# Patient Record
Sex: Male | Born: 1945 | Race: Black or African American | Hispanic: No | Marital: Single | Smoking: Former smoker
Health system: Southern US, Community
[De-identification: ages and names within clinical notes are randomized; demographics above are authoritative.]

## PROBLEM LIST (undated history)

## (undated) DIAGNOSIS — I10 Essential (primary) hypertension: Secondary | ICD-10-CM

## (undated) DIAGNOSIS — I2510 Atherosclerotic heart disease of native coronary artery without angina pectoris: Secondary | ICD-10-CM

## (undated) HISTORY — PX: CORONARY ARTERY BYPASS GRAFT: SHX141

---

## 2011-04-30 ENCOUNTER — Emergency Department: Payer: Self-pay | Admitting: Emergency Medicine

## 2011-04-30 LAB — CBC
HCT: 45.2 % (ref 40.0–52.0)
MCH: 33.9 pg (ref 26.0–34.0)
MCHC: 34.3 g/dL (ref 32.0–36.0)
RDW: 14.4 % (ref 11.5–14.5)

## 2011-04-30 LAB — COMPREHENSIVE METABOLIC PANEL
BUN: 8 mg/dL (ref 7–18)
Calcium, Total: 8.8 mg/dL (ref 8.5–10.1)
Chloride: 108 mmol/L — ABNORMAL HIGH (ref 98–107)
Co2: 27 mmol/L (ref 21–32)
EGFR (African American): >60
SGOT(AST): 31 U/L (ref 15–37)
SGPT (ALT): 24 U/L
Sodium: 147 mmol/L — ABNORMAL HIGH (ref 136–145)

## 2011-04-30 LAB — SALICYLATE LEVEL: Salicylates, Serum: 2.3 mg/dL

## 2011-04-30 LAB — ETHANOL: Ethanol: 258 mg/dL

## 2017-04-09 ENCOUNTER — Emergency Department
Admission: EM | Admit: 2017-04-09 | Discharge: 2017-04-10 | Discharge status: Against Medical Advice | Payer: Medicare Other | Attending: Emergency Medicine | Admitting: Emergency Medicine

## 2017-04-09 ENCOUNTER — Encounter: Payer: Self-pay | Admitting: Emergency Medicine

## 2017-04-09 ENCOUNTER — Emergency Department: Payer: Self-pay

## 2017-04-09 DIAGNOSIS — Z23 Encounter for immunization: Secondary | ICD-10-CM | POA: Insufficient documentation

## 2017-04-09 DIAGNOSIS — W19XXXA Unspecified fall, initial encounter: Secondary | ICD-10-CM | POA: Insufficient documentation

## 2017-04-09 DIAGNOSIS — I2510 Atherosclerotic heart disease of native coronary artery without angina pectoris: Secondary | ICD-10-CM | POA: Insufficient documentation

## 2017-04-09 DIAGNOSIS — S0181XA Laceration without foreign body of other part of head, initial encounter: Secondary | ICD-10-CM

## 2017-04-09 DIAGNOSIS — Z87891 Personal history of nicotine dependence: Secondary | ICD-10-CM | POA: Insufficient documentation

## 2017-04-09 DIAGNOSIS — R51 Headache: Secondary | ICD-10-CM | POA: Insufficient documentation

## 2017-04-09 DIAGNOSIS — Y999 Unspecified external cause status: Secondary | ICD-10-CM | POA: Insufficient documentation

## 2017-04-09 DIAGNOSIS — F10129 Alcohol abuse with intoxication, unspecified: Secondary | ICD-10-CM | POA: Insufficient documentation

## 2017-04-09 DIAGNOSIS — S0990XA Unspecified injury of head, initial encounter: Secondary | ICD-10-CM

## 2017-04-09 DIAGNOSIS — Y939 Activity, unspecified: Secondary | ICD-10-CM | POA: Insufficient documentation

## 2017-04-09 DIAGNOSIS — F10929 Alcohol use, unspecified with intoxication, unspecified: Secondary | ICD-10-CM

## 2017-04-09 DIAGNOSIS — Y929 Unspecified place or not applicable: Secondary | ICD-10-CM | POA: Insufficient documentation

## 2017-04-09 DIAGNOSIS — Y908 Blood alcohol level of 240 mg/100 ml or more: Secondary | ICD-10-CM | POA: Insufficient documentation

## 2017-04-09 DIAGNOSIS — I10 Essential (primary) hypertension: Secondary | ICD-10-CM | POA: Insufficient documentation

## 2017-04-09 DIAGNOSIS — F1010 Alcohol abuse, uncomplicated: Secondary | ICD-10-CM

## 2017-04-09 HISTORY — DX: Essential (primary) hypertension: I10

## 2017-04-09 HISTORY — DX: Atherosclerotic heart disease of native coronary artery without angina pectoris: I25.10

## 2017-04-09 LAB — COMPREHENSIVE METABOLIC PANEL
ALBUMIN: 4.5 g/dL (ref 3.5–5.0)
ALK PHOS: 51 U/L (ref 38–126)
ALT: 23 U/L (ref 17–63)
AST: 50 U/L — AB (ref 15–41)
Anion gap: 12 (ref 5–15)
BILIRUBIN TOTAL: 0.8 mg/dL (ref 0.3–1.2)
BUN: 14 mg/dL (ref 6–20)
CALCIUM: 8.8 mg/dL — AB (ref 8.9–10.3)
CO2: 22 mmol/L (ref 22–32)
CREATININE: 1 mg/dL (ref 0.61–1.24)
Chloride: 102 mmol/L (ref 101–111)
GFR calc Af Amer: >60 mL/min (ref >60)
GFR calc non Af Amer: >60 mL/min (ref >60)
GLUCOSE: 84 mg/dL (ref 65–99)
POTASSIUM: 3.9 mmol/L (ref 3.5–5.1)
Sodium: 136 mmol/L (ref 135–145)
TOTAL PROTEIN: 8 g/dL (ref 6.5–8.1)

## 2017-04-09 LAB — CBC WITH DIFFERENTIAL/PLATELET
BASOS ABS: 0 10*3/uL (ref 0–0.1)
BASOS PCT: 0 %
EOS ABS: 0 10*3/uL (ref 0–0.7)
Eosinophils Relative: 0 %
HCT: 43.5 % (ref 40.0–52.0)
Hemoglobin: 14.9 g/dL (ref 13.0–18.0)
LYMPHS ABS: 3.3 10*3/uL (ref 1.0–3.6)
LYMPHS PCT: 52 %
MCH: 33.9 pg (ref 26.0–34.0)
MCHC: 34.3 g/dL (ref 32.0–36.0)
MCV: 98.9 fL (ref 80.0–100.0)
MONO ABS: 0.8 10*3/uL (ref 0.2–1.0)
Monocytes Relative: 12 %
NEUTROS ABS: 2.3 10*3/uL (ref 1.4–6.5)
Neutrophils Relative %: 36 %
PLATELETS: 163 10*3/uL (ref 150–440)
RBC: 4.4 MIL/uL (ref 4.40–5.90)
RDW: 14.5 % (ref 11.5–14.5)
WBC: 6.4 10*3/uL (ref 3.8–10.6)

## 2017-04-09 LAB — ETHANOL: ALCOHOL ETHYL (B): 291 mg/dL — AB (ref <10)

## 2017-04-09 MED ORDER — TETANUS-DIPHTH-ACELL PERTUSSIS 5-2.5-18.5 LF-MCG/0.5 IM SUSP
0.5000 mL | Freq: Once | INTRAMUSCULAR | Status: AC
  Administered 2017-04-09: 0.5 mL via INTRAMUSCULAR
  Filled 2017-04-09: qty 0.5

## 2017-04-09 MED ORDER — HALOPERIDOL LACTATE 5 MG/ML IJ SOLN
5.0000 mg | Freq: Once | INTRAMUSCULAR | Status: AC
  Administered 2017-04-09: 5 mg via INTRAVENOUS
  Filled 2017-04-09: qty 1
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
## 2017-04-09 NOTE — ED Notes (Signed)
Patient transported to CT 

## 2017-04-09 NOTE — ED Provider Notes (Signed)
[facility] Emergency Department Provider Note  ____________________________________________   First MD Initiated Contact with Patient 04/09/17 2129     (approximate)  I have reviewed the triage vital signs and the nursing notes.   HISTORY  Chief Complaint Fall and Alcohol Intoxication  Level 5 exemption history limited by the patient's alcohol intoxication  HPI Edgar Mcguire is a 72 y.o. male who comes to the emergency department via EMS after apparently falling down while intoxicated.  The patient is unable to provide any further history as he is heavily intoxicated and uncooperative.  Past Medical History:  Diagnosis Date  . Coronary artery disease   . Hypertension     There are no active problems to display for this patient.     Prior to Admission medications   Not on File    Allergies Patient has no known allergies.  No family history on file.  Social History Social History   Tobacco Use  . Smoking status: Former Games developer  . Smokeless tobacco: Never Used  Substance Use Topics  . Alcohol use: Yes    Alcohol/week: 100.8 oz    Types: 168 Cans of beer per week  . Drug use: Yes    Types: Cocaine    Review of Systems Level 5 exemption history limited by the patient's alcohol intoxication  ____________________________________________   PHYSICAL EXAM:  VITAL SIGNS: ED Triage Vitals  Enc Vitals Group     BP      Pulse      Resp      Temp      Temp src      SpO2      Weight      Height      Head Circumference      Peak Flow      Pain Score      Pain Loc      Pain Edu?      Excl. in GC?     Constitutional: Heavily intoxicated.  Obvious trauma to his right brow with some bleeding.  Uncooperative cursing Eyes: PERRL EOMI. Head: 2 cm laceration right brow. Nose: No congestion/rhinnorhea. Mouth/Throat: No trismus Neck: No stridor.   Cardiovascular: Normal rate, regular rhythm. Grossly normal heart sounds.  Good  peripheral circulation. Respiratory: Normal respiratory effort.  No retractions. Lungs CTAB and moving good air Gastrointestinal: Soft nontender Musculoskeletal: No lower extremity edema   Neurologic:  N No gross focal neurologic deficits are appreciated. Skin:  Skin is warm, dry and intact. No rash noted. Psychiatric: Heavily intoxicated   ____________________________________________   DIFFERENTIAL includes but not limited to  Alcohol intoxication, intracerebral hemorrhage, laceration, cervical spine fracture ____________________________________________   LABS (all labs ordered are listed, but only abnormal results are displayed)  Labs Reviewed  COMPREHENSIVE METABOLIC PANEL - Abnormal; Notable for the following components:      Result Value   Calcium 8.8 (*)    AST 50 (*)    All other components within normal limits  ETHANOL - Abnormal; Notable for the following components:   Alcohol, Ethyl (B) 291 (*)    All other components within normal limits  CBC WITH DIFFERENTIAL/PLATELET  URINE DRUG SCREEN, QUALITATIVE ([facility] ONLY)    Lab work reviewed by me shows significantly elevated ethanol level __________________________________________  EKG   ____________________________________________  RADIOLOGY  Head and cervical spine CT is negative for acute pathology ____________________________________________   PROCEDURES  Procedure(s) performed: yes  .Marland KitchenLaceration Repair Date/Time: 04/10/2017 12:01 AM Performed by: Lamont Snowball,  Lloyd HugerNeil, MD Authorized by: Merrily Brittleifenbark, Viviana Trimble, MD   Consent:    Consent obtained:  Verbal   Consent given by:  Patient   Risks discussed:  Infection, pain, retained foreign body, poor cosmetic result and poor wound healing Anesthesia (see MAR for exact dosages):    Anesthesia method:  None Laceration details:    Length (cm):  2 Repair type:    Repair type:  Simple Exploration:    Hemostasis achieved with:  Direct pressure   Wound exploration:  entire depth of wound probed and visualized     Contaminated: no   Treatment:    Area cleansed with:  Saline   Amount of cleaning:  Extensive   Irrigation solution:  Sterile saline   Visualized foreign bodies/material removed: no   Skin repair:    Repair method:  Tissue adhesive Approximation:    Approximation:  Close Post-procedure details:    Dressing:  Sterile dressing   Patient tolerance of procedure:  Tolerated well, no immediate complications     Critical Care performed: no  Observation: no ____________________________________________   INITIAL IMPRESSION / ASSESSMENT AND PLAN / ED COURSE  Pertinent labs & imaging results that were available during my care of the patient were reviewed by me and considered in my medical decision making (see chart for details).  On arrival the patient is heavily intoxicated, uncooperative, verbally abusive, and has obvious head trauma.  Haloperidol given for the patient's own safety to facilitate a medical workup searching for an organic cause of his behavior.  Fortunately the patient's head and cervical spine CTs are negative for acute pathology.  His alcohol level is extremely elevated at 291.  On further evaluation he had a simple 2 cm laceration to his right brow that I washed out and was able to closed with Dermabond with good cosmesis.  At this point the patient is unstable to go home secondary to alcohol intoxication as well as chemical restraints.  Once he awakes likely in the morning he will be stable for discharge.      ____________________________________________   FINAL CLINICAL IMPRESSION(S) / ED DIAGNOSES  Final diagnoses:  Alcoholic intoxication with complication (HCC)  ETOH abuse  Injury of head, initial encounter  Facial laceration, initial encounter      NEW MEDICATIONS STARTED DURING THIS VISIT:  This SmartLink is deprecated. Use AVSMEDLIST instead to display the medication list for a patient.   Note:   This document was prepared using Dragon voice recognition software and may include unintentional dictation errors.     Merrily Brittleifenbark, Kamorah Nevils, MD 04/10/17 [phone]

## 2017-04-10 LAB — URINE DRUG SCREEN, QUALITATIVE (ARMC ONLY)
AMPHETAMINES, UR SCREEN: NOT DETECTED
BARBITURATES, UR SCREEN: NOT DETECTED
Benzodiazepine, Ur Scrn: NOT DETECTED
COCAINE METABOLITE, UR ~~LOC~~: POSITIVE — AB
Cannabinoid 50 Ng, Ur ~~LOC~~: NOT DETECTED
MDMA (ECSTASY) UR SCREEN: NOT DETECTED
METHADONE SCREEN, URINE: NOT DETECTED
Opiate, Ur Screen: NOT DETECTED
Phencyclidine (PCP) Ur S: NOT DETECTED
TRICYCLIC, UR SCREEN: NOT DETECTED

## 2017-04-10 NOTE — ED Notes (Signed)
Patient repositioned in bed. Denies need for anything at this time.

## 2017-04-10 NOTE — ED Notes (Signed)
Bed alarm placed on patient for safety 

## 2017-04-10 NOTE — ED Provider Notes (Signed)
 -----------------------------------------   10:19 AM on 04/10/2017 -----------------------------------------  This morning patient was awake, tolerating oral intake and having steady gait in the treatment room. Planning for discharge and awaiting for the patient to coordinate a ride home, he was found to have eloped from the treatment room. He has been found to be clinically sober, vital signs are normal, no distress or acute symptoms noted, and has medical decision-making capacity. Unfortunately, the patient still had a peripheral IV so police have been notified to return the patient to the ED so the IV can be removed.   Sharman CheekStafford, Rishit Burkhalter, MD 04/10/17 1020

## 2017-04-10 NOTE — ED Notes (Addendum)
Pt not present in WR, [employer] officer notified but does not see the patient. Informed that pt left the exam room without notifying anyone with his IV. Attempted to call the number listed and is out of service. The address is not valid..Marland Kitchen

## 2017-04-10 NOTE — ED Notes (Signed)
Patient continues to sleep soundly. Bed alarm in place. Patient with even steady breathing. Will continue to monitor.

## 2017-04-11 NOTE — ED Notes (Signed)
This RN went to round on patient at 1000 and patient was out of his room. This RN looked for IV in room but could not locate. Pt possibly still has 20g IV to left forearm. This RN tried calling his phone number; not a correct number. Charge RN Judeth CornfieldStephanie confirmed address on file is not a valid address. This RN notified [employer] officer out front who conveyed that we were looking for pt with description to officers on duty. Pt was able to ambulate well prior to leaving; MD had placed dermabond on his laceration above right eye. Pt has swelling around right eye.

## 2020-03-03 ENCOUNTER — Emergency Department
Admission: EM | Admit: 2020-03-03 | Discharge: 2020-03-03 | Disposition: A | Discharge status: Home / Self Care | Payer: No Typology Code available for payment source | Attending: Emergency Medicine | Admitting: Emergency Medicine

## 2020-03-03 ENCOUNTER — Emergency Department: Payer: No Typology Code available for payment source

## 2020-03-03 ENCOUNTER — Other Ambulatory Visit: Payer: Self-pay

## 2020-03-03 ENCOUNTER — Encounter: Payer: Self-pay | Admitting: Radiology

## 2020-03-03 DIAGNOSIS — Z20822 Contact with and (suspected) exposure to covid-19: Secondary | ICD-10-CM | POA: Insufficient documentation

## 2020-03-03 DIAGNOSIS — M545 Low back pain, unspecified: Secondary | ICD-10-CM | POA: Insufficient documentation

## 2020-03-03 DIAGNOSIS — F1020 Alcohol dependence, uncomplicated: Secondary | ICD-10-CM | POA: Diagnosis not present

## 2020-03-03 DIAGNOSIS — R7989 Other specified abnormal findings of blood chemistry: Secondary | ICD-10-CM | POA: Insufficient documentation

## 2020-03-03 DIAGNOSIS — R079 Chest pain, unspecified: Secondary | ICD-10-CM | POA: Insufficient documentation

## 2020-03-03 DIAGNOSIS — R000 Tachycardia, unspecified: Secondary | ICD-10-CM | POA: Diagnosis not present

## 2020-03-03 DIAGNOSIS — I2510 Atherosclerotic heart disease of native coronary artery without angina pectoris: Secondary | ICD-10-CM | POA: Insufficient documentation

## 2020-03-03 DIAGNOSIS — I10 Essential (primary) hypertension: Secondary | ICD-10-CM | POA: Insufficient documentation

## 2020-03-03 DIAGNOSIS — R059 Cough, unspecified: Secondary | ICD-10-CM | POA: Diagnosis not present

## 2020-03-03 DIAGNOSIS — R0682 Tachypnea, not elsewhere classified: Secondary | ICD-10-CM | POA: Diagnosis not present

## 2020-03-03 DIAGNOSIS — Z87891 Personal history of nicotine dependence: Secondary | ICD-10-CM | POA: Insufficient documentation

## 2020-03-03 DIAGNOSIS — Z951 Presence of aortocoronary bypass graft: Secondary | ICD-10-CM | POA: Diagnosis not present

## 2020-03-03 DIAGNOSIS — R778 Other specified abnormalities of plasma proteins: Secondary | ICD-10-CM

## 2020-03-03 LAB — BASIC METABOLIC PANEL
Anion gap: 16 — ABNORMAL HIGH (ref 5–15)
BUN: 14 mg/dL (ref 8–23)
CO2: 20 mmol/L — ABNORMAL LOW (ref 22–32)
Calcium: 8.7 mg/dL — ABNORMAL LOW (ref 8.9–10.3)
Chloride: 99 mmol/L (ref 98–111)
Creatinine, Ser: 0.86 mg/dL (ref 0.61–1.24)
GFR, Estimated: >60 mL/min (ref >60)
Glucose, Bld: 80 mg/dL (ref 70–99)
Potassium: 3.9 mmol/L (ref 3.5–5.1)
Sodium: 135 mmol/L (ref 135–145)

## 2020-03-03 LAB — CBC
HCT: 37 % — ABNORMAL LOW (ref 39.0–52.0)
Hemoglobin: 13.2 g/dL (ref 13.0–17.0)
MCH: 33.2 pg (ref 26.0–34.0)
MCHC: 35.7 g/dL (ref 30.0–36.0)
MCV: 93 fL (ref 80.0–100.0)
Platelets: 158 10*3/uL (ref 150–400)
RBC: 3.98 MIL/uL — ABNORMAL LOW (ref 4.22–5.81)
RDW: 13.1 % (ref 11.5–15.5)
WBC: 5.5 10*3/uL (ref 4.0–10.5)
nRBC: 0 % (ref 0.0–0.2)

## 2020-03-03 LAB — TROPONIN I (HIGH SENSITIVITY)
Troponin I (High Sensitivity): 18 ng/L — ABNORMAL HIGH (ref <18)
Troponin I (High Sensitivity): 17 ng/L (ref <18)

## 2020-03-03 LAB — HEPATIC FUNCTION PANEL
ALT: 13 U/L (ref 0–44)
AST: 30 U/L (ref 15–41)
Albumin: 4.1 g/dL (ref 3.5–5.0)
Alkaline Phosphatase: 50 U/L (ref 38–126)
Bilirubin, Direct: 0.1 mg/dL (ref 0.0–0.2)
Indirect Bilirubin: 1.1 mg/dL — ABNORMAL HIGH (ref 0.3–0.9)
Total Bilirubin: 1.2 mg/dL (ref 0.3–1.2)
Total Protein: 7 g/dL (ref 6.5–8.1)

## 2020-03-03 LAB — RESP PANEL BY RT-PCR (FLU A&B, COVID) ARPGX2
Influenza A by PCR: NEGATIVE
Influenza B by PCR: NEGATIVE
SARS Coronavirus 2 by RT PCR: NEGATIVE

## 2020-03-03 LAB — LIPASE, BLOOD: Lipase: 17 U/L (ref 11–51)

## 2020-03-03 LAB — ETHANOL: Alcohol, Ethyl (B): <10 mg/dL (ref <10)

## 2020-03-03 MED ORDER — ALUM & MAG HYDROXIDE-SIMETH 200-200-20 MG/5ML PO SUSP
30.0000 mL | Freq: Once | ORAL | Status: AC
  Administered 2020-03-03: 30 mL via ORAL
  Filled 2020-03-03: qty 30

## 2020-03-03 MED ORDER — THIAMINE HCL 100 MG/ML IJ SOLN: 100.0000 mg | Freq: Every day | INTRAMUSCULAR | Status: DC

## 2020-03-03 MED ORDER — LORAZEPAM 2 MG/ML IJ SOLN
2.0000 mg | Freq: Once | INTRAMUSCULAR | Status: AC
  Administered 2020-03-03: 2 mg via INTRAVENOUS
  Filled 2020-03-03: qty 1

## 2020-03-03 MED ORDER — LORAZEPAM 2 MG/ML IJ SOLN: 0.0000 mg | Freq: Four times a day (QID) | INTRAMUSCULAR | Status: DC

## 2020-03-03 MED ORDER — LORAZEPAM 2 MG PO TABS: 0.0000 mg | ORAL_TABLET | Freq: Four times a day (QID) | ORAL | Status: DC

## 2020-03-03 MED ORDER — ASPIRIN 81 MG PO CHEW
324.0000 mg | CHEWABLE_TABLET | Freq: Once | ORAL | Status: AC
  Administered 2020-03-03: 324 mg via ORAL
  Filled 2020-03-03: qty 4

## 2020-03-03 MED ORDER — LIDOCAINE VISCOUS HCL 2 % MT SOLN
15.0000 mL | Freq: Once | OROMUCOSAL | Status: AC
  Administered 2020-03-03: 15 mL via ORAL
  Filled 2020-03-03: qty 15

## 2020-03-03 MED ORDER — SODIUM CHLORIDE 0.9 % IV SOLN: Freq: Once | INTRAVENOUS | Status: AC

## 2020-03-03 MED ORDER — IOHEXOL 350 MG/ML SOLN
75.0000 mL | Freq: Once | INTRAVENOUS | Status: AC | PRN
  Administered 2020-03-03: 75 mL via INTRAVENOUS

## 2020-03-03 MED ORDER — THIAMINE HCL 100 MG PO TABS: 100.0000 mg | ORAL_TABLET | Freq: Every day | ORAL | Status: DC

## 2020-03-03 MED ORDER — LORAZEPAM 2 MG/ML IJ SOLN: 0.0000 mg | Freq: Two times a day (BID) | INTRAMUSCULAR | Status: DC

## 2020-03-03 MED ORDER — LORAZEPAM 2 MG/ML IJ SOLN: 1.0000 mg | Freq: Once | INTRAMUSCULAR | Status: DC

## 2020-03-03 MED ORDER — NITROGLYCERIN 0.4 MG SL SUBL: 0.4000 mg | SUBLINGUAL_TABLET | SUBLINGUAL | Status: DC | PRN

## 2020-03-03 MED ORDER — LORAZEPAM 2 MG PO TABS: 0.0000 mg | ORAL_TABLET | Freq: Two times a day (BID) | ORAL | Status: DC

## 2020-03-03 NOTE — ED Triage Notes (Signed)
Pt arrives via ems from a hotel, ems reports pt is homeless, pt c/o chest pain and points to the left side of his abd and reports low back pain, states that he thinks it is related to the push ups he has been doing, pt reported to ems that he had a beer this am, pt was given 1.5 inches of nitro paste to his chest by ems, pt denies taking any medicine this am [address] [place] Recent Vital Signs   There were no vitals taken for this visit.   Past Medical History:  Diagnosis Date  . Coronary artery disease   . Hypertension      Expected Discharge Date     Diet Order    None       VTE Documentation      Work Intensity Score/Level of Care     @LEVELOFCARE @   Mobility        Significant Events    DC Barriers   Abnormal Labs:  Edgar Mcguire 03/03/2020, 7:16 AM inches o

## 2020-03-03 NOTE — ED Notes (Signed)
Pt taken to ct 

## 2020-03-03 NOTE — ED Notes (Signed)
Pt back from x-ray, re-connected to the monitor, pt asking for water, reassured that after the dr sees him I would bring him some

## 2020-03-03 NOTE — ED Notes (Signed)
AAOx3.  Skin warm and dry.  NAD 

## 2020-03-03 NOTE — ED Notes (Signed)
Pt resting quietly, lights turned down for comfort and warm blanket given

## 2020-03-03 NOTE — ED Notes (Signed)
ALL  PAPERWORK  FAXED  TO  [facility]  [facility]  PER  DR  Erma Heritage MD

## 2020-03-03 NOTE — ED Provider Notes (Addendum)
[facility] Emergency Department Provider Note  ____________________________________________   First MD Initiated Contact with Patient 03/03/20 0725     (approximate)  I have reviewed the triage vital signs and the nursing notes.   HISTORY  Chief Complaint Chest Pain    HPI Edgar Mcguire is a 74 y.o. male  With h/o CP, low back pain.  Patient states that he woke up this morning with fairly significant aching, pressure-like, but also sharp left-sided chest pain.  He states that the pain feels somewhat similar to when he had his MIs in the past, but also feels like his chest pain when he does too many push-ups.  The pain is somewhat worse with palpation and movement, but also worse when he exerts himself.  No specific alleviating factors.  He does admit to drinking fairly regularly, but does not believe he drink any more than usual over the last several days.  No actual abdominal pain, nausea, or vomiting.  He does have a cough with occasional sputum production, but this is not necessarily new.  No leg swelling.  No known history of DVT/PE.        Past Medical History:  Diagnosis Date  . Coronary artery disease   . Hypertension     There are no problems to display for this patient.   Past Surgical History:  Procedure Laterality Date  . CORONARY ARTERY BYPASS GRAFT      Prior to Admission medications   Not on File    Allergies Patient has no known allergies.  No family history on file.  Social History Social History   Tobacco Use  . Smoking status: Former Games developer  . Smokeless tobacco: Never Used  Vaping Use  . Vaping Use: Never used  Substance Use Topics  . Alcohol use: Yes    Alcohol/week: 168.0 standard drinks    Types: 168 Cans of beer per week  . Drug use: Yes    Types: Cocaine    Review of Systems  Review of Systems  Constitutional: Positive for fatigue. Negative for chills and fever.  HENT: Negative for sore throat.     Respiratory: Positive for cough, chest tightness and shortness of breath.   Cardiovascular: Positive for chest pain.  Gastrointestinal: Negative for abdominal pain.  Genitourinary: Negative for flank pain.  Musculoskeletal: Negative for neck pain.  Skin: Negative for rash and wound.  Allergic/Immunologic: Negative for immunocompromised state.  Neurological: Negative for weakness and numbness.  Hematological: Does not bruise/bleed easily.  All other systems reviewed and are negative.    ____________________________________________  PHYSICAL EXAM:      VITAL SIGNS: ED Triage Vitals  Enc Vitals Group     BP 03/03/20 0718 (!) 150/98     Pulse Rate 03/03/20 0718 100     Resp 03/03/20 0718 18     Temp 03/03/20 0718 98.3 F (36.8 C)     Temp Source 03/03/20 0718 Oral     SpO2 03/03/20 0718 97 %     Weight 03/03/20 0719 165 lb (74.8 kg)     Height 03/03/20 0719 5\' 8"  (1.727 m)     Head Circumference --      Peak Flow --      Pain Score 03/03/20 0719 7     Pain Loc --      Pain Edu? --      Excl. in GC? --      Physical Exam Vitals and nursing note reviewed.  Constitutional:  General: He is not in acute distress.    Appearance: He is well-developed.  HENT:     Head: Normocephalic and atraumatic.  Eyes:     Conjunctiva/sclera: Conjunctivae normal.  Cardiovascular:     Rate and Rhythm: Regular rhythm. Tachycardia present.     Heart sounds: Normal heart sounds. No murmur heard.  No friction rub.     Comments: Mild TTP over left anterior parasternal chest wall. No bruising or deformity. Pulmonary:     Effort: Pulmonary effort is normal. Tachypnea present. No respiratory distress.     Breath sounds: No wheezing.  Abdominal:     General: There is no distension.     Palpations: Abdomen is soft.     Tenderness: There is no abdominal tenderness.  Musculoskeletal:     Cervical back: Neck supple.  Skin:    General: Skin is warm.     Capillary Refill: Capillary refill  takes less than 2 seconds.  Neurological:     Mental Status: He is alert and oriented to person, place, and time.     Motor: No abnormal muscle tone.       ____________________________________________   LABS (all labs ordered are listed, but only abnormal results are displayed)  Labs Reviewed  BASIC METABOLIC PANEL - Abnormal; Notable for the following components:      Result Value   CO2 20 (*)    Calcium 8.7 (*)    Anion gap 16 (*)    All other components within normal limits  CBC - Abnormal; Notable for the following components:   RBC 3.98 (*)    HCT 37.0 (*)    All other components within normal limits  HEPATIC FUNCTION PANEL - Abnormal; Notable for the following components:   Indirect Bilirubin 1.1 (*)    All other components within normal limits  TROPONIN I (HIGH SENSITIVITY) - Abnormal; Notable for the following components:   Troponin I (High Sensitivity) 18 (*)    All other components within normal limits  RESP PANEL BY RT-PCR (FLU A&B, COVID) ARPGX2  LIPASE, BLOOD  ETHANOL  URINE DRUG SCREEN, QUALITATIVE ([facility] ONLY)  TROPONIN I (HIGH SENSITIVITY)    ____________________________________________  EKG: Normal sinus rhythm, ventricular rate 94.  PR 171, QRS 72, QTc 469.  No acute ST elevations.  Nonspecific ST changes. ________________________________________  RADIOLOGY All imaging, including plain films, CT scans, and ultrasounds, independently reviewed by me, and interpretations confirmed via formal radiology reads.  ED MD interpretation:   CXR: No focal abnormality  Official radiology report(s): DG Chest 2 View  Result Date: 03/03/2020 CLINICAL DATA:  Chest pain EXAM: CHEST - 2 VIEW COMPARISON:  None. FINDINGS: No focal consolidation, pneumothorax or pleural effusion. Cardiomediastinal silhouette within normal limits. Sequela of prior CABG. No acute osseous abnormality. IMPRESSION: No focal airspace disease. Electronically Signed   By: Stana Buntinghikanele  Emekauwa  M.D.   On: 03/03/2020 08:04   CT Angio Chest PE W and/or Wo Contrast  Result Date: 03/03/2020 CLINICAL DATA:  Chest pain and shortness of breath. EXAM: CT ANGIOGRAPHY CHEST WITH CONTRAST TECHNIQUE: Multidetector CT imaging of the chest was performed using the standard protocol during bolus administration of intravenous contrast. Multiplanar CT image reconstructions and MIPs were obtained to evaluate the vascular anatomy. CONTRAST:  75mL OMNIPAQUE IOHEXOL 350 MG/ML SOLN COMPARISON:  None. FINDINGS: Cardiovascular: The heart is normal in size. No pericardial effusion. There is tortuosity and ectasia of the thoracic aorta but no dissection. Mild aneurysmal dilatation of the posterior aspect of  the aortic arch with maximum measurement of 3.5 cm. There are moderate scattered atherosclerotic calcifications involving the aorta and extensive three-vessel coronary artery calcifications. Surgical changes from coronary artery bypass surgery are noted. The pulmonary arterial tree is well opacified. No filling defects to suggest pulmonary embolism. There is some reflux of contrast down the IVC and into the hepatic veins suggesting tricuspid regurgitation. Mediastinum/Nodes: No mediastinal or hilar mass or lymphadenopathy. The esophagus is grossly normal. The thyroid gland is grossly normal. Lungs/Pleura: No acute pulmonary findings. No infiltrates, edema or effusions. No worrisome pulmonary lesions. No pneumothorax. Mild underlying emphysematous changes are noted. Upper Abdomen: No significant upper abdominal findings. Musculoskeletal: The bony structures are intact. No acute rib, sternal or thoracic vertebral body fractures. Review of the MIP images confirms the above findings. IMPRESSION: 1. No CT findings for pulmonary embolism. 2. Tortuosity and ectasia of the thoracic aorta but no dissection. 3. Mild aneurysmal dilatation of the posterior aspect of the aortic arch with maximum measurement of 3.5 cm. Recommend annual  imaging followup by CTA or MRA. This recommendation follows 2010 ACCF/AHA/AATS/ACR/ASA/SCA/SCAI/SIR/STS/SVM Guidelines for the Diagnosis and Management of Patients with Thoracic Aortic Disease. Circulation.2010; 121: X528-U132. Aortic aneurysm NOS (ICD10-I71.9) 4. Surgical changes from coronary artery bypass surgery. Extensive three-vessel coronary artery calcifications are noted. 5. No acute pulmonary findings. 6. Mild underlying emphysematous changes. 7. Emphysema and aortic atherosclerosis. Aortic Atherosclerosis (ICD10-I70.0) and Emphysema (ICD10-J43.9). Electronically Signed   By: Rudie Meyer M.D.   On: 03/03/2020 10:09    ____________________________________________  PROCEDURES   Procedure(s) performed (including Critical Care):  Procedures  ____________________________________________  INITIAL IMPRESSION / MDM / ASSESSMENT AND PLAN / ED COURSE  As part of my medical decision making, I reviewed the following data within the electronic MEDICAL RECORD NUMBER Nursing notes reviewed and incorporated, Old chart reviewed, Notes from prior ED visits, and [place] Controlled Substance Database       *ADONUS USELMAN was evaluated in Emergency Department on 03/03/2020 for the symptoms described in the history of present illness. He was evaluated in the context of the global COVID-19 pandemic, which necessitated consideration that the patient might be at risk for infection with the SARS-CoV-2 virus that causes COVID-19. Institutional protocols and algorithms that pertain to the evaluation of patients at risk for COVID-19 are in a state of rapid change based on information released by regulatory bodies including the CDC and federal and state organizations. These policies and algorithms were followed during the patient's care in the ED.  Some ED evaluations and interventions may be delayed as a result of limited staffing during the pandemic.*     Medical Decision Making: 74 year old male with history of CAD  status post CABG here with chest pain that woke him up from sleep.  EKG reviewed on arrival, shows no acute ST elevations or depressions.  Does have some nonspecific ST flattening.  Initial lab work shows troponin of 18, which is just above threshold for positivity.  That being said, he has known, severe CAD and is adamant that his pain is similar to his prior heart attacks.  This, along with the elevated troponin and excellent response to nitroglycerin in the ED is concerning for possible underlying angina component.  Given his extensive history, feel it is reasonable to at least observe him.  Discussed with Dr. Elesa Massed at the [facility] in [place], who is in agreement.  Otherwise, differential remains pain related to alcoholic gastritis, versus possible cocaine/substance related pain.  Given his pleuritic component, CT angio  obtained and shows no evidence of acute abnormality.  He does have an incidental aortic aneurysm that can follow-up as an outpatient.  Patient is otherwise nontoxic and well-appearing.  He does appear mildly dehydrated based on labs and gentle fluids have been given.  Of note, the patient has an extensive polysubstance history.  He is not intoxicated currently and alcohol level is negative.  He did demonstrate some slight paranoia, though no HI, SI, auditory visual hallucinations.  There could certainly be a component of early alcohol withdrawal.  Will place him on CIWA, and he was given Ativan here given his extensive history of dependence.  ADDENDUM: Improved w/ ativan. Slightly drowsy but airway protected, RR normal, no signs of hypoxia or retention. He is able to wake up and answer questions. Ativan given per CIWA protocol. EMS here to pick up. ____________________________________________  FINAL CLINICAL IMPRESSION(S) / ED DIAGNOSES  Final diagnoses:  Chest pain with high risk for cardiac etiology  Elevated troponin  Uncomplicated alcohol dependence (HCC)     MEDICATIONS GIVEN DURING  THIS VISIT:  Medications  nitroGLYCERIN (NITROSTAT) SL tablet 0.4 mg (has no administration in time range)  0.9 %  sodium chloride infusion (has no administration in time range)  LORazepam (ATIVAN) injection 0-4 mg (has no administration in time range)    Or  LORazepam (ATIVAN) tablet 0-4 mg (has no administration in time range)  LORazepam (ATIVAN) injection 0-4 mg (has no administration in time range)    Or  LORazepam (ATIVAN) tablet 0-4 mg (has no administration in time range)  thiamine tablet 100 mg (has no administration in time range)    Or  thiamine (B-1) injection 100 mg (has no administration in time range)  alum & mag hydroxide-simeth (MAALOX/MYLANTA) 200-200-20 MG/5ML suspension 30 mL (30 mLs Oral Given 03/03/20 0809)    And  lidocaine (XYLOCAINE) 2 % viscous mouth solution 15 mL (15 mLs Oral Given 03/03/20 0809)  aspirin chewable tablet 324 mg (324 mg Oral Given 03/03/20 0809)  iohexol (OMNIPAQUE) 350 MG/ML injection 75 mL (75 mLs Intravenous Contrast Given 03/03/20 0936)  LORazepam (ATIVAN) injection 2 mg (2 mg Intravenous Given 03/03/20 1015)     ED Discharge Orders    None       Note:  This document was prepared using Dragon voice recognition software and may include unintentional dictation errors.   Shaune Pollack, MD 03/03/20 1018    Shaune Pollack, MD 03/03/20 1123

## 2020-03-03 NOTE — ED Notes (Signed)
Report to scott in ER at [facility]

## 2020-03-03 NOTE — ED Notes (Signed)
Xray here to take pt over for his cxr

## 2020-10-27 ENCOUNTER — Encounter: Payer: Self-pay | Admitting: Emergency Medicine

## 2020-10-27 ENCOUNTER — Emergency Department
Admission: EM | Admit: 2020-10-27 | Discharge: 2020-10-31 | Disposition: A | Discharge status: Home / Self Care | Payer: No Typology Code available for payment source | Attending: Student in an Organized Health Care Education/Training Program | Admitting: Student in an Organized Health Care Education/Training Program

## 2020-10-27 ENCOUNTER — Other Ambulatory Visit: Payer: Self-pay

## 2020-10-27 ENCOUNTER — Emergency Department: Payer: No Typology Code available for payment source

## 2020-10-27 DIAGNOSIS — F22 Delusional disorders: Secondary | ICD-10-CM | POA: Insufficient documentation

## 2020-10-27 DIAGNOSIS — R4182 Altered mental status, unspecified: Secondary | ICD-10-CM | POA: Diagnosis not present

## 2020-10-27 DIAGNOSIS — F23 Brief psychotic disorder: Secondary | ICD-10-CM

## 2020-10-27 DIAGNOSIS — F1010 Alcohol abuse, uncomplicated: Secondary | ICD-10-CM

## 2020-10-27 DIAGNOSIS — Z20822 Contact with and (suspected) exposure to covid-19: Secondary | ICD-10-CM | POA: Insufficient documentation

## 2020-10-27 DIAGNOSIS — I2510 Atherosclerotic heart disease of native coronary artery without angina pectoris: Secondary | ICD-10-CM | POA: Insufficient documentation

## 2020-10-27 DIAGNOSIS — Y900 Blood alcohol level of less than 20 mg/100 ml: Secondary | ICD-10-CM | POA: Diagnosis not present

## 2020-10-27 DIAGNOSIS — Z87891 Personal history of nicotine dependence: Secondary | ICD-10-CM | POA: Insufficient documentation

## 2020-10-27 DIAGNOSIS — F19959 Other psychoactive substance use, unspecified with psychoactive substance-induced psychotic disorder, unspecified: Secondary | ICD-10-CM

## 2020-10-27 DIAGNOSIS — I10 Essential (primary) hypertension: Secondary | ICD-10-CM | POA: Diagnosis not present

## 2020-10-27 DIAGNOSIS — Z046 Encounter for general psychiatric examination, requested by authority: Secondary | ICD-10-CM | POA: Diagnosis present

## 2020-10-27 DIAGNOSIS — F1410 Cocaine abuse, uncomplicated: Secondary | ICD-10-CM

## 2020-10-27 LAB — CBC
HCT: 32.6 % — ABNORMAL LOW (ref 39.0–52.0)
Hemoglobin: 11.5 g/dL — ABNORMAL LOW (ref 13.0–17.0)
MCH: 33.2 pg (ref 26.0–34.0)
MCHC: 35.3 g/dL (ref 30.0–36.0)
MCV: 94.2 fL (ref 80.0–100.0)
Platelets: 286 10*3/uL (ref 150–400)
RBC: 3.46 MIL/uL — ABNORMAL LOW (ref 4.22–5.81)
RDW: 14.6 % (ref 11.5–15.5)
WBC: 7.7 10*3/uL (ref 4.0–10.5)
nRBC: 0.3 % — ABNORMAL HIGH (ref 0.0–0.2)

## 2020-10-27 LAB — COMPREHENSIVE METABOLIC PANEL
ALT: 36 U/L (ref 0–44)
AST: 44 U/L — ABNORMAL HIGH (ref 15–41)
Albumin: 3.7 g/dL (ref 3.5–5.0)
Alkaline Phosphatase: 53 U/L (ref 38–126)
Anion gap: 15 (ref 5–15)
BUN: 17 mg/dL (ref 8–23)
CO2: 23 mmol/L (ref 22–32)
Calcium: 8.5 mg/dL — ABNORMAL LOW (ref 8.9–10.3)
Chloride: 96 mmol/L — ABNORMAL LOW (ref 98–111)
Creatinine, Ser: 1.01 mg/dL (ref 0.61–1.24)
GFR, Estimated: >60 mL/min (ref >60)
Glucose, Bld: 92 mg/dL (ref 70–99)
Potassium: 3.8 mmol/L (ref 3.5–5.1)
Sodium: 134 mmol/L — ABNORMAL LOW (ref 135–145)
Total Bilirubin: 1.3 mg/dL — ABNORMAL HIGH (ref 0.3–1.2)
Total Protein: 7 g/dL (ref 6.5–8.1)

## 2020-10-27 LAB — ETHANOL: Alcohol, Ethyl (B): <10 mg/dL (ref <10)

## 2020-10-27 LAB — SALICYLATE LEVEL: Salicylate Lvl: <7 mg/dL — ABNORMAL LOW (ref 7.0–30.0)

## 2020-10-27 LAB — RESP PANEL BY RT-PCR (FLU A&B, COVID) ARPGX2
Influenza A by PCR: NEGATIVE
Influenza B by PCR: NEGATIVE
SARS Coronavirus 2 by RT PCR: NEGATIVE

## 2020-10-27 LAB — ACETAMINOPHEN LEVEL: Acetaminophen (Tylenol), Serum: <10 ug/mL — ABNORMAL LOW (ref 10–30)

## 2020-10-27 LAB — PHOSPHORUS: Phosphorus: 3.8 mg/dL (ref 2.5–4.6)

## 2020-10-27 LAB — MAGNESIUM: Magnesium: 2.4 mg/dL (ref 1.7–2.4)

## 2020-10-27 MED ORDER — THIAMINE HCL 100 MG/ML IJ SOLN
100.0000 mg | Freq: Once | INTRAMUSCULAR | Status: AC
  Administered 2020-10-27: 100 mg via INTRAMUSCULAR
  Filled 2020-10-27: qty 2

## 2020-10-27 MED ORDER — DIPHENHYDRAMINE HCL 50 MG/ML IJ SOLN
50.0000 mg | Freq: Four times a day (QID) | INTRAMUSCULAR | Status: DC | PRN
  Administered 2020-10-27: 50 mg via INTRAMUSCULAR
  Filled 2020-10-27: qty 1

## 2020-10-27 MED ORDER — LORAZEPAM 2 MG/ML IJ SOLN: 1.0000 mg | INTRAMUSCULAR | Status: AC | PRN

## 2020-10-27 MED ORDER — LORAZEPAM 1 MG PO TABS: 1.0000 mg | ORAL_TABLET | ORAL | Status: AC | PRN

## 2020-10-27 MED ORDER — LORAZEPAM 2 MG/ML IJ SOLN
2.0000 mg | Freq: Four times a day (QID) | INTRAMUSCULAR | Status: DC | PRN
  Administered 2020-10-27: 2 mg via INTRAMUSCULAR
  Filled 2020-10-27: qty 1

## 2020-10-27 MED ORDER — ADULT MULTIVITAMIN W/MINERALS CH
1.0000 | ORAL_TABLET | Freq: Every day | ORAL | Status: DC
  Administered 2020-10-30 – 2020-10-31 (×2): 1 via ORAL
  Filled 2020-10-27 (×3): qty 1

## 2020-10-27 MED ORDER — FOLIC ACID 1 MG PO TABS
1.0000 mg | ORAL_TABLET | Freq: Every day | ORAL | Status: DC
  Administered 2020-10-30 – 2020-10-31 (×2): 1 mg via ORAL
  Filled 2020-10-27 (×3): qty 1

## 2020-10-27 MED ORDER — HALOPERIDOL LACTATE 5 MG/ML IJ SOLN
5.0000 mg | Freq: Four times a day (QID) | INTRAMUSCULAR | Status: DC | PRN
  Administered 2020-10-27: 5 mg via INTRAMUSCULAR
  Filled 2020-10-27: qty 1

## 2020-10-27 NOTE — ED Notes (Signed)
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

## 2020-10-27 NOTE — BH Assessment (Signed)
Comprehensive Clinical Assessment (CCA) Note  10/27/2020 Britt Bolognese [id number]  Edgar Mcguire is a 75 year old male who presents to the ER via law enforcement, after he was found in the road appearing confused and about to get hit by on coming traffic. When writer attempted to speak with the patient, he became agitated and irritable. He told the writer to asked his nurse for what he need. "I don't need to talk right now. You don't know what you talking about. Go on now. Here me, go on now, you hear?" Per the report of the niece, giving to psychiatrist, he becomes like this when he abuses cocaine and alcohol extended period of time go without sleep for several days. After he rest for a few days and sober, he returns to base line. He's coherent and lucid. During the time of this interview, his UDS wasn't collected and his BAC was negative of alcohol.   Chief Complaint:  Chief Complaint  Patient presents with   Mental Health Problem   Visit Diagnosis: Substance Induced Psychosis    CCA Screening, Triage and Referral (STR)  Patient Reported Information How did you hear about [place]? Other (Comment)  What Is the Reason for Your Visit/Call Today? Patient found in the road psychotic.  How Long Has This Been Causing You Problems? 1 wk - 1 month  What Do You Feel Would Help You the Most Today? Alcohol or Drug Use Treatment; Treatment for Depression or other mood problem   Have You Recently Had Any Thoughts About Hurting Yourself? No  Are You Planning to Commit Suicide/Harm Yourself At This time? No   Have you Recently Had Thoughts About Hurting Someone Karolee Ohs? No  Are You Planning to Harm Someone at This Time? No  Explanation: No data recorded  Have You Used Any Alcohol or Drugs in the Past 24 Hours? -- (Unknown, no UDS at this time.)  How Long Ago Did You Use Drugs or Alcohol? No data recorded What Did You Use and How Much? No data recorded  Do You Currently Have a  Therapist/Psychiatrist? No  Name of Therapist/Psychiatrist: No data recorded  Have You Been Recently Discharged From Any Office Practice or Programs? No  Explanation of Discharge From Practice/Program: No data recorded    CCA Screening Triage Referral Assessment Type of Contact: Face-to-Face  Telemedicine Service Delivery:   Is this Initial or Reassessment? No data recorded Date Telepsych consult ordered in [facility]:  No data recorded Time Telepsych consult ordered in [facility]:  No data recorded Location of Assessment: [facility] ED  Provider Location: [facility] ED   Collateral Involvement: Family member   Does Patient Have a Automotive engineer Guardian? No data recorded Name and Contact of Legal Guardian: No data recorded If Minor and Not Living with Parent(s), Who has Custody? No data recorded Is CPS involved or ever been involved? Never  Is APS involved or ever been involved? Never   Patient Determined To Be At Risk for Harm To Self or Others Based on Review of Patient Reported Information or Presenting Complaint? No  Method: No data recorded Availability of Means: No data recorded Intent: No data recorded Notification Required: No data recorded Additional Information for Danger to Others Potential: No data recorded Additional Comments for Danger to Others Potential: No data recorded Are There Guns or Other Weapons in Your Home? No data recorded Types of Guns/Weapons: No data recorded Are These Weapons Safely Secured?  No data recorded Who Could Verify You Are Able To Have These Secured: No data recorded Do You Have any Outstanding Charges, Pending Court Dates, Parole/Probation? No data recorded Contacted To Inform of Risk of Harm To Self or Others: No data recorded   Does Patient Present under Involuntary Commitment? Yes  IVC Papers Initial File Date: 10/27/20   [place] of Residence:    Patient Currently Receiving the Following Services: Not  Receiving Services   Determination of Need: Emergent (2 hours)   Options For Referral: No data recorded    CCA Biopsychosocial Patient Reported Schizophrenia/Schizoaffective Diagnosis in Past: No   Strengths: Have support, able to take care of ADL's when at baseline, connected to the [facility].   Mental Health Symptoms Depression:   None   Duration of Depressive symptoms:    Mania:   Change in energy/activity; Increased Energy; Racing thoughts; Recklessness   Anxiety:    N/A   Psychosis:   Delusions; Grossly disorganized speech   Duration of Psychotic symptoms:  Duration of Psychotic Symptoms: Less than six months   Trauma:   N/A   Obsessions:   N/A   Compulsions:   N/A   Inattention:   N/A   Hyperactivity/Impulsivity:   Blurts out answers   Oppositional/Defiant Behaviors:   N/A   Emotional Irregularity:   N/A   Other Mood/Personality Symptoms:  No data recorded   Mental Status Exam Appearance and self-care  Stature:   Average   Weight:   Average weight   Clothing:   Disheveled   Grooming:   Neglected   Cosmetic use:   None   Posture/gait:   Normal   Motor activity:   -- (within normal range)   Sensorium  Attention:   Distractible; Inattentive   Concentration:   Focuses on irrelevancies; Scattered   Orientation:   Person   Recall/memory:   -- (Unable to asses, patient refused to answer)   Affect and Mood  Affect:   Restricted   Mood:   Irritable   Relating  Eye contact:   Avoided   Facial expression:   Constricted   Attitude toward examiner:   Defensive; Irritable   Thought and Language  Speech flow:  Pressured   Thought content:   -- (Unable to asses)   Preoccupation:   Other (Comment)   Hallucinations:   -- (Unable to asses)   Organization:  No data recorded  [employer] of Knowledge:  No data recorded  Intelligence:   -- (Unable to asses)   Abstraction:   --  (Unable to asses)   Judgement:   -- (Unable to asses)   Reality Testing:   -- (Unable to asses)   Insight:   -- (Unable to asses)   Decision Making:   -- (Unable to asses)   Social Functioning  Social Maturity:   -- (Unable to asses)   Social Judgement:  No data recorded  Stress  Stressors:   -- (Unable to asses)   Coping Ability:   -- (Unable to asses)   Skill Deficits:   -- (Unable to asses)   Supports:   Family     Religion: Religion/Spirituality Are You A Religious Person?:  (Unable to asses)  Leisure/Recreation: Leisure / Recreation Do You Have Hobbies?:  (Unable to asses)  Exercise/Diet: Exercise/Diet Do You Exercise?:  (Unable to asses) Have You Gained or Lost A Significant Amount of Weight in the Past Six Months?:  (Unable to asses) Do You Follow a  Special Diet?:  (Unable to asses) Do You Have Any Trouble Sleeping?:  (Unable to asses)   CCA Employment/Education Employment/Work Situation: Employment / Work Situation 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?: Yes Type of Psychiatric Treatment/Services in [employer]: Unknown of the specifics  Education: Education Is Patient Currently Attending School?: No   CCA Family/Childhood History Family and Relationship History: Family history Marital status: Single Does patient have children?:  (Unable to asses)  Childhood History:  Childhood History Did patient suffer any verbal/emotional/physical/sexual abuse as a child?:  (Unable to asses) Did patient suffer from severe childhood neglect?:  (Unable to asses) Has patient ever been sexually abused/assaulted/raped as an adolescent or adult?:  (Unable to asses) Was the patient ever a victim of a crime or a disaster?:  (Unable to asses) Witnessed domestic violence?:  (Unable to asses) Has patient been affected by  domestic violence as an adult?:  (Unable to asses)  Child/Adolescent Assessment:     CCA Substance Use Alcohol/Drug Use: Alcohol / Drug Use Pain Medications: See PTA Prescriptions: See PTA Over the Counter: See PTA History of alcohol / drug use?: Yes Longest period of sobriety (when/how long): Unable to quantify Substance #1 Name of Substance 1: Cocaine (Per history and report of the family) Substance #2 Name of Substance 2: Alcohol (Per history and report of the family)                     ASAM's:  Six Dimensions of Multidimensional Assessment  Dimension 1:  Acute Intoxication and/or Withdrawal Potential:      Dimension 2:  Biomedical Conditions and Complications:      Dimension 3:  Emotional, Behavioral, or Cognitive Conditions and Complications:     Dimension 4:  Readiness to Change:     Dimension 5:  Relapse, Continued use, or Continued Problem Potential:     Dimension 6:  Recovery/Living Environment:     ASAM Severity Score:    ASAM Recommended Level of Treatment:     Substance use Disorder (SUD)    Recommendations for Services/Supports/Treatments:    Discharge Disposition:    DSM5 Diagnoses: Patient Active Problem List   Diagnosis Date Noted   Cocaine abuse (HCC) 10/27/2020   Alcohol abuse 10/27/2020   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

## 2020-10-27 NOTE — Consult Note (Signed)
[facility] Face-to-Face Psychiatry Consult   Reason for Consult: Consult for 75 year old man brought in under IVC after being found wandering around in the middle of the street acting bizarre Referring Physician: Su Hoff Patient Identification: Edgar Mcguire MRN:  [id number] Principal Diagnosis: Psychoactive substance-induced psychosis (HCC) Diagnosis:  Principal Problem:   Psychoactive substance-induced psychosis (HCC) Active Problems:   Cocaine abuse (HCC)   Alcohol abuse   Total Time spent with patient: 1 hour  Subjective:   Edgar Mcguire is a 75 y.o. male patient admitted with "get away from me you are part of the plot".  HPI: Patient seen and chart reviewed.  Spoke also with his niece who is listed as his contact in the chart.  75 year old man brought here under IVC.  Law enforcement apparently picked him up where he was allegedly walking in the middle of the street endangering himself acting agitated and confused.  Patient was sleepy when he first came to the emergency room but this afternoon has woken up and is now very agitated.  Refused to cooperate with the interview.  Shouted abuse that me every time I would even attempt to speak with him.  Indicated that he believed that I and others around here are part of some sort of plot but did not go into details.  Alcohol level was negative.  No urine has been obtained for drug screen.  Patient has not yet been physically aggressive but is currently quite loud.  Speaking to his niece she tells me that he is a veteran with chronic nerve problems and that he also has a habit of drinking and using cocaine too much.  She says that she believes he has probably been without sleep for 3 to 5 days using cocaine and drinking heavily.  He reports that in the past he will get like this when he has not slept for days and then after resting up we will get back to normal lucid mental state.  Nothing in the old chart about any previous psychiatric diagnoses.  Unknown  whether he gets any VA care or has ever been diagnosed with PTSD.  No report of any seizures with alcohol withdrawal.  Past Psychiatric History: Niece reports that this sort of behavior when intoxicated for several days is familiar.  Unknown whether he has ever had any specific treatment nothing and are chart available.  Risk to Self:   Risk to Others:   Prior Inpatient Therapy:   Prior Outpatient Therapy:    Past Medical History:  Past Medical History:  Diagnosis Date   Coronary artery disease    Hypertension     Past Surgical History:  Procedure Laterality Date   CORONARY ARTERY BYPASS GRAFT     Family History: No family history on file. Family Psychiatric  History: None reported Social History:  Social History   Substance and Sexual Activity  Alcohol Use Yes   Alcohol/week: 168.0 standard drinks   Types: 168 Cans of beer per week   Comment: beer, wine     Social History   Substance and Sexual Activity  Drug Use Not Currently   Types: Cocaine, Marijuana    Social History   Socioeconomic History   Marital status: Single    Spouse name: Not on file   Number of children: Not on file   Years of education: Not on file   Highest education level: Not on file  Occupational History   Not on file  Tobacco Use   Smoking status: Former  Smokeless tobacco: Never  Vaping Use   Vaping Use: Never used  Substance and Sexual Activity   Alcohol use: Yes    Alcohol/week: 168.0 standard drinks    Types: 168 Cans of beer per week    Comment: beer, wine   Drug use: Not Currently    Types: Cocaine, Marijuana   Sexual activity: Not on file  Other Topics Concern   Not on file  Social History Narrative   Not on file   Social Determinants of Health   Financial Resource Strain: Not on file  Food Insecurity: Not on file  Transportation Needs: Not on file  Physical Activity: Not on file  Stress: Not on file  Social Connections: Not on file   Additional Social History:     Allergies:  No Known Allergies  Labs:  Results for orders placed or performed during the hospital encounter of 10/27/20 (from the past 48 hour(s))  Comprehensive metabolic panel     Status: Abnormal   Collection Time: 10/27/20  8:40 AM  Result Value Ref Range   Sodium 134 (L) 135 - 145 mmol/L   Potassium 3.8 3.5 - 5.1 mmol/L   Chloride 96 (L) 98 - 111 mmol/L   CO2 23 22 - 32 mmol/L   Glucose, Bld 92 70 - 99 mg/dL    Comment: Glucose reference range applies only to samples taken after fasting for at least 8 hours.   BUN 17 8 - 23 mg/dL   Creatinine, Ser 1.30 0.61 - 1.24 mg/dL   Calcium 8.5 (L) 8.9 - 10.3 mg/dL   Total Protein 7.0 6.5 - 8.1 g/dL   Albumin 3.7 3.5 - 5.0 g/dL   AST 44 (H) 15 - 41 U/L   ALT 36 0 - 44 U/L   Alkaline Phosphatase 53 38 - 126 U/L   Total Bilirubin 1.3 (H) 0.3 - 1.2 mg/dL   GFR, Estimated >86 >57 mL/min    Comment: (NOTE) Calculated using the CKD-EPI Creatinine Equation (2021)    Anion gap 15 5 - 15    Comment: Performed at [facility], [address] [place], [place] [address]  Ethanol     Status: None   Collection Time: 10/27/20  8:40 AM  Result Value Ref Range   Alcohol, Ethyl (B) <10 <10 mg/dL    Comment: (NOTE) Lowest detectable limit for serum alcohol is 10 mg/dL.  For medical purposes only. Performed at [facility], [address] [place], [place] [address]   Salicylate level     Status: Abnormal   Collection Time: 10/27/20  8:40 AM  Result Value Ref Range   Salicylate Lvl <7.0 (L) 7.0 - 30.0 mg/dL    Comment: Performed at [facility], [address] [place], [place] [address]  Acetaminophen level     Status: Abnormal   Collection Time: 10/27/20  8:40 AM  Result Value Ref Range   Acetaminophen (Tylenol), Serum <10 (L) 10 - 30 ug/mL    Comment: (NOTE) Therapeutic concentrations vary significantly. A range of 10-30 ug/mL  may be an effective concentration for many patients. However, some  are best  treated at concentrations outside of this range. Acetaminophen concentrations >150 ug/mL at 4 hours after ingestion  and >50 ug/mL at 12 hours after ingestion are often associated with  toxic reactions.  Performed at [facility], [address], [place], [place] [address]   cbc     Status: Abnormal   Collection Time: 10/27/20  8:40 AM  Result Value Ref Range   WBC 7.7 4.0 - 10.5 K/uL   RBC 3.46 (L) 4.22 - 5.81 MIL/uL   Hemoglobin 11.5 (L) 13.0 - 17.0 g/dL   HCT 96.032.6 (L) 45.439.0 - 09.852.0 %   MCV 94.2 80.0 - 100.0 fL   MCH 33.2 26.0 - 34.0 pg   MCHC 35.3 30.0 - 36.0 g/dL   RDW 11.914.6 14.711.5 - 82.915.5 %   Platelets 286 150 - 400 K/uL   nRBC 0.3 (H) 0.0 - 0.2 %    Comment: Performed at [facility], [address] [place], [place] [address]  Resp Panel by RT-PCR (Flu A&B, Covid) Nasopharyngeal Swab     Status: None   Collection Time: 10/27/20 10:22 AM   Specimen: Nasopharyngeal Swab; Nasopharyngeal(NP) swabs in vial transport medium  Result Value Ref Range   SARS Coronavirus 2 by RT PCR NEGATIVE NEGATIVE    Comment: (NOTE) SARS-CoV-2 target nucleic acids are NOT DETECTED.  The SARS-CoV-2 RNA is generally detectable in upper respiratory specimens during the acute phase of infection. The lowest concentration of SARS-CoV-2 viral copies this assay can detect is 138 copies/mL. A negative result does not preclude SARS-Cov-2 infection and should not be used as the sole basis for treatment or other patient management decisions. A negative result may occur with  improper specimen collection/handling, submission of specimen other than nasopharyngeal swab, presence of viral mutation(s) within the areas targeted by this assay, and inadequate number of viral copies(<138 copies/mL). A negative result must be combined with clinical observations, patient history, and epidemiological information. The expected result is Negative.  Fact Sheet for Patients:   BloggerCourse.comhttps://www.fda.gov/media/152166/download  Fact Sheet for Healthcare Providers:  SeriousBroker.ithttps://www.fda.gov/media/152162/download  This test is no t yet approved or cleared by the [place] FDA and  has been authorized for detection and/or diagnosis of SARS-CoV-2 by FDA under an Emergency Use Authorization (EUA). This EUA will remain  in effect (meaning this test can be used) for the duration of the COVID-19 declaration under Section 564(b)(1) of the Act, 21 U.S.C.section 360bbb-3(b)(1), unless the authorization is terminated  or revoked sooner.       Influenza A by PCR NEGATIVE NEGATIVE   Influenza B by PCR NEGATIVE NEGATIVE    Comment: (NOTE) The Xpert Xpress SARS-CoV-2/FLU/RSV plus assay is intended as an aid in the diagnosis of influenza from Nasopharyngeal swab specimens and should not be used as a sole basis for treatment. Nasal washings and aspirates are unacceptable for Xpert Xpress SARS-CoV-2/FLU/RSV testing.  Fact Sheet for Patients: BloggerCourse.comhttps://www.fda.gov/media/152166/download  Fact Sheet for Healthcare Providers: SeriousBroker.ithttps://www.fda.gov/media/152162/download  This test is not yet approved or cleared by the [place] FDA and has been authorized for detection and/or diagnosis of SARS-CoV-2 by FDA under an Emergency Use Authorization (EUA). This EUA will remain in effect (meaning this test can be used) for the duration of the COVID-19 declaration under Section 564(b)(1) of the Act, 21 U.S.C. section 360bbb-3(b)(1), unless the authorization is terminated or revoked.  Performed at [facility], [address] [place], [place] [address]     Current Facility-Administered Medications  Medication Dose Route Frequency Provider Last Rate Last Admin   diphenhydrAMINE (BENADRYL) injection 50 mg  50 mg Intramuscular Q6H PRN Babe Anthis T, MD       haloperidol lactate (HALDOL) injection 5 mg  5 mg Intramuscular Q6H PRN Zeyad Delaguila T, MD       LORazepam (ATIVAN)  injection 2 mg  2 mg Intramuscular Q6H PRN Rachelle Edwards T,  MD       thiamine (B-1) injection 100 mg  100 mg Intramuscular Once Cobi Delph, Jackquline Denmark, MD       No current outpatient medications on file.    Musculoskeletal: Strength & Muscle Tone: within normal limits Gait & Station: normal Patient leans: N/A            Psychiatric Specialty Exam:  Presentation  General Appearance:  No data recorded Eye Contact: No data recorded Speech: No data recorded Speech Volume: No data recorded Handedness: No data recorded  Mood and Affect  Mood: No data recorded Affect: No data recorded  Thought Process  Thought Processes: No data recorded Descriptions of Associations:No data recorded Orientation:No data recorded Thought Content:No data recorded History of Schizophrenia/Schizoaffective disorder:No data recorded Duration of Psychotic Symptoms:No data recorded Hallucinations:No data recorded Ideas of Reference:No data recorded Suicidal Thoughts:No data recorded Homicidal Thoughts:No data recorded  Sensorium  Memory: No data recorded Judgment: No data recorded Insight: No data recorded  Executive Functions  Concentration: No data recorded Attention Span: No data recorded Recall: No data recorded Fund of Knowledge: No data recorded Language: No data recorded  Psychomotor Activity  Psychomotor Activity: No data recorded  Assets  Assets: No data recorded  Sleep  Sleep: No data recorded  Physical Exam: Physical Exam Vitals and nursing note reviewed.  Constitutional:      Appearance: Normal appearance.  HENT:     Head: Normocephalic and atraumatic.     Mouth/Throat:     Pharynx: Oropharynx is clear.  Eyes:     Pupils: Pupils are equal, round, and reactive to light.  Cardiovascular:     Rate and Rhythm: Normal rate and regular rhythm.  Pulmonary:     Effort: Pulmonary effort is normal.     Breath sounds: Normal breath sounds.  Abdominal:      General: Abdomen is flat.     Palpations: Abdomen is soft.  Musculoskeletal:        General: Normal range of motion.  Skin:    General: Skin is warm and dry.  Neurological:     General: No focal deficit present.     Mental Status: He is alert. Mental status is at baseline.  Psychiatric:        Attention and Perception: He is inattentive.        Mood and Affect: Affect is labile, angry and inappropriate.        Speech: Speech is rapid and pressured and tangential.        Behavior: Behavior is agitated.        Thought Content: Thought content is paranoid.        Cognition and Memory: Cognition is impaired. Memory is impaired.        Judgment: Judgment is inappropriate.   Review of Systems  Unable to perform ROS: Psychiatric disorder  Blood pressure (!) 155/82, pulse 85, temperature 98 F (36.7 C), temperature source Oral, resp. rate 18, height 5\' 8"  (1.727 m), weight 74.8 kg, SpO2 100 %. Body mass index is 25.07 kg/m.  Treatment Plan Summary: Medication management and Plan 75 year old man picked up psychotic and agitated with collateral reports that he has been drinking and using drugs.  No alcohol found on presentation no drug screen done yet.  Patient refusing to cooperate with any kind of evaluation.  I have put orders in the chart for Haldol Ativan and Benadryl injections as needed for agitation.  We can also put in orders for the usual alcohol  detox.  I have ordered a one-time shot of thiamine IM.  Patient will remain under IVC.  No psychiatric beds available here.  Unlikely to be admitted anywhere else but if we were going to try we would first look to the [place].  We will reassess him tomorrow to see if he has settled down and if his mental state is improved any.  Disposition: Supportive therapy provided about ongoing stressors. See above.  Currently uncooperative with any kind of treatment planning we will just try to safely manage behavior and wait for some improvement.  Mordecai Rasmussen, MD 10/27/2020 4:49 PM

## 2020-10-27 NOTE — ED Notes (Signed)
INVOLUNTARY with all papers on chart/ security and nurse aware [facility] consult

## 2020-10-27 NOTE — ED Notes (Signed)
Pts dinner tray and juice placed at bedside d/t pt sleeping.

## 2020-10-27 NOTE — ED Provider Notes (Signed)
[facility] Emergency Department Provider Note    Event Date/Time   First MD Initiated Contact with Patient 10/27/20 [phone]     (approximate)  I have reviewed the triage vital signs and the nursing notes.   HISTORY  Chief Complaint Mental Health Problem    HPI Edgar Mcguire is a 75 y.o. male below listed past medical history presents to the ER under IVC by [employer] after being found walking on [address].  Patient with altered mental status reportedly stating that he needs to go to the bank before it closes.  Reportedly thinks that people were chasing him and trying to harm him.  Reportedly acting very paranoid.  No history of the same.  Patient would not provide any additional history right now he is resting in the hallway with sheet pulled up over his head.  Past Medical History:  Diagnosis Date   Coronary artery disease    Hypertension    No family history on file. Past Surgical History:  Procedure Laterality Date   CORONARY ARTERY BYPASS GRAFT     There are no problems to display for this patient.     Prior to Admission medications   Not on File    Allergies Patient has no known allergies.    Social History Social History   Tobacco Use   Smoking status: Former   Smokeless tobacco: Never  Building services engineer Use: Never used  Substance Use Topics   Alcohol use: Yes    Alcohol/week: 168.0 standard drinks    Types: 168 Cans of beer per week    Comment: beer, wine   Drug use: Not Currently    Types: Cocaine, Marijuana    Review of Systems Patient denies headaches, rhinorrhea, blurry vision, numbness, shortness of breath, chest pain, edema, cough, abdominal pain, nausea, vomiting, diarrhea, dysuria, fevers, rashes or hallucinations unless otherwise stated above in HPI. ____________________________________________   PHYSICAL EXAM:  VITAL SIGNS: Vitals:   10/27/20 0846  BP: (!) 155/82  Pulse: 85  Resp:  18  Temp: 98 F (36.7 C)  SpO2: 100%    Constitutional: NAD,  refusing to participate with exam Eyes: Conjunctivae are normal.  Head: Atraumatic. Nose: No congestion/rhinnorhea. Mouth/Throat: Mucous membranes are moist.   Neck: No stridor. Painless ROM.  Cardiovascular: Normal rate, regular rhythm. Grossly normal heart sounds.  Good peripheral circulation. Respiratory: Normal respiratory effort.  No retractions. Lungs CTAB. Gastrointestinal: Soft and nontender. No distention. No abdominal bruits. No CVA tenderness. Genitourinary: deferred Musculoskeletal: No lower extremity tenderness nor edema.  No joint effusions. Neurologic:  Normal speech and language. No gross focal neurologic deficits are appreciated. No facial droop Skin:  Skin is warm, dry and intact. No rash noted. Psychiatric: appears disheveled, uncooperative with exam  ____________________________________________   LABS (all labs ordered are listed, but only abnormal results are displayed)  Results for orders placed or performed during the hospital encounter of 10/27/20 (from the past 24 hour(s))  Comprehensive metabolic panel     Status: Abnormal   Collection Time: 10/27/20  8:40 AM  Result Value Ref Range   Sodium 134 (L) 135 - 145 mmol/L   Potassium 3.8 3.5 - 5.1 mmol/L   Chloride 96 (L) 98 - 111 mmol/L   CO2 23 22 - 32 mmol/L   Glucose, Bld 92 70 - 99 mg/dL   BUN 17 8 - 23 mg/dL   Creatinine, Ser 0.09 0.61 - 1.24 mg/dL   Calcium 8.5 (  L) 8.9 - 10.3 mg/dL   Total Protein 7.0 6.5 - 8.1 g/dL   Albumin 3.7 3.5 - 5.0 g/dL   AST 44 (H) 15 - 41 U/L   ALT 36 0 - 44 U/L   Alkaline Phosphatase 53 38 - 126 U/L   Total Bilirubin 1.3 (H) 0.3 - 1.2 mg/dL   GFR, Estimated >38 >17 mL/min   Anion gap 15 5 - 15  Ethanol     Status: None   Collection Time: 10/27/20  8:40 AM  Result Value Ref Range   Alcohol, Ethyl (B) <10 <10 mg/dL  Salicylate level     Status: Abnormal   Collection Time: 10/27/20  8:40 AM  Result  Value Ref Range   Salicylate Lvl <7.0 (L) 7.0 - 30.0 mg/dL  Acetaminophen level     Status: Abnormal   Collection Time: 10/27/20  8:40 AM  Result Value Ref Range   Acetaminophen (Tylenol), Serum <10 (L) 10 - 30 ug/mL  cbc     Status: Abnormal   Collection Time: 10/27/20  8:40 AM  Result Value Ref Range   WBC 7.7 4.0 - 10.5 K/uL   RBC 3.46 (L) 4.22 - 5.81 MIL/uL   Hemoglobin 11.5 (L) 13.0 - 17.0 g/dL   HCT 71.1 (L) 65.7 - 90.3 %   MCV 94.2 80.0 - 100.0 fL   MCH 33.2 26.0 - 34.0 pg   MCHC 35.3 30.0 - 36.0 g/dL   RDW 83.3 38.3 - 29.1 %   Platelets 286 150 - 400 K/uL   nRBC 0.3 (H) 0.0 - 0.2 %   ____________________________________________ ____________________________________________  RADIOLOGY  I personally reviewed all radiographic images ordered to evaluate for the above acute complaints and reviewed radiology reports and findings.  These findings were personally discussed with the patient.  Please see medical record for radiology report.  ____________________________________________   PROCEDURES  Procedure(s) performed:  Procedures    Critical Care performed: no ____________________________________________   INITIAL IMPRESSION / ASSESSMENT AND PLAN / ED COURSE  Pertinent labs & imaging results that were available during my care of the patient were reviewed by me and considered in my medical decision making (see chart for details).   DDX: Psychosis, delirium, medication effect, noncompliance, polysubstance abuse, Si, Hi, depression   Edgar Mcguire is a 75 y.o. who presents to the ED with agitation as described above.  Patient uncooperative with exam but not combative at this time.  Does not have any evidence of trauma.  Given documentation IVC papers certainly sounds paranoid and concern for underlying psychiatric illness.  Blood work sent for the blood differential will order CT imaging given his age.  Will consult psychiatry.  We will maintain IVC.   The patient has  been placed in psychiatric observation due to the need to provide a safe environment for the patient while obtaining psychiatric consultation and evaluation, as well as ongoing medical and medication management to treat the patient's condition.  The patient has been placed under full IVC at this time.   The patient was evaluated in Emergency Department today for the symptoms described in the history of present illness. He/she was evaluated in the context of the global COVID-19 pandemic, which necessitated consideration that the patient might be at risk for infection with the SARS-CoV-2 virus that causes COVID-19. Institutional protocols and algorithms that pertain to the evaluation of patients at risk for COVID-19 are in a state of rapid change based on information released by regulatory bodies including the  CDC and federal and [employer]. These policies and algorithms were followed during the patient's care in the ED.  As part of my medical decision making, I reviewed the following data within the electronic MEDICAL RECORD NUMBER Nursing notes reviewed and incorporated, Labs reviewed, notes from prior ED visits and [place] Controlled Substance Database   ____________________________________________   FINAL CLINICAL IMPRESSION(S) / ED DIAGNOSES  Final diagnoses:  Paranoia (HCC)      NEW MEDICATIONS STARTED DURING THIS VISIT:  New Prescriptions   No medications on file     Note:  This document was prepared using Dragon voice recognition software and may include unintentional dictation errors.    Willy Eddy, MD 10/27/20 [phone]

## 2020-10-27 NOTE — ED Notes (Signed)
Pt given lunch tray.

## 2020-10-27 NOTE — ED Notes (Signed)
Pt provided with meal at this time

## 2020-10-27 NOTE — ED Triage Notes (Signed)
Arrives with [employer] under IVC paperwork.  Per IVC paperwork, patient has been standing in the roadway on [address].  He believes people are chasing him and trying to kill him.  Patient is agitated in triage.  Repeatedly stating that he needs to get to [facility] before the bank closes.  Stating he doesn't want to go through this process.  Agitated with any response to questions.  Denies SI/ HI

## 2020-10-28 NOTE — ED Notes (Signed)
Breakfast tray placed in room at this time. Pt is currently sleeping.

## 2020-10-28 NOTE — ED Notes (Signed)
Lunch tray placed in the room at this time. Pt currently sleeping.

## 2020-10-28 NOTE — TOC Initial Note (Signed)
Transition of Care [facility]) - Initial/Assessment Note    Patient Details  Name: Edgar Mcguire MRN: [id number] Date of Birth: 06-23-1945  [facility] CM/SW Contact:    Marina Goodell Phone Number: [phone] 10/28/2020, 11:05 AM  Clinical Narrative:                  Patient is currently IVC pending inpatient psychiatric placement. Please place new TOC consult for SA counseling when patient is medically and psychiatrically stable.        Patient Goals and CMS Choice        Expected Discharge Plan and Services                                                Prior Living Arrangements/Services                       Activities of Daily Living      Permission Sought/Granted                  Emotional Assessment              Admission diagnosis:  beh med eval IVC Patient Active Problem List   Diagnosis Date Noted   Cocaine abuse (HCC) 10/27/2020   Alcohol abuse 10/27/2020   Psychoactive substance-induced psychosis (HCC) 10/27/2020   PCP:  Patient, No Pcp Per (Inactive) Pharmacy:  No Pharmacies Listed    Social Determinants of Health (SDOH) Interventions    Readmission Risk Interventions No flowsheet data found.

## 2020-10-28 NOTE — ED Notes (Signed)
Dinner tray given

## 2020-10-28 NOTE — ED Notes (Signed)
IVC pending placement 

## 2020-10-28 NOTE — ED Provider Notes (Signed)
Emergency Medicine Observation Re-evaluation Note  Edgar Mcguire is a 75 y.o. male, seen on rounds today.  Pt initially presented to the ED for complaints of Mental Health Problem  Currently, the patient is calm, no acute complaints.  Physical Exam  Blood pressure (!) 158/102, pulse 79, temperature 98.1 F (36.7 C), temperature source Axillary, resp. rate 16, height 5\' 8"  (1.727 m), weight 74.8 kg, SpO2 100 %. Physical Exam General: NAD Lungs: CTAB Psych: not agitated  ED Course / MDM  EKG:    I have reviewed the labs performed to date as well as medications administered while in observation.  Recent changes in the last 24 hours include no acute events overnight.    Plan  Current plan is for psych reassessment. Patient is under full IVC at this time.   , MD 10/28/20 [phone]

## 2020-10-28 NOTE — ED Notes (Signed)
IVC pending reassessment 

## 2020-10-28 NOTE — ED Notes (Addendum)
This pt up to the bathroom at this time. This RN to the pt room. Pt refusing VS at this time. Pt also refusing medications. Pt shaking his head, "I am going back to sleep, just leave me be." Pt laid back down to sleep.

## 2020-10-29 NOTE — ED Notes (Signed)
IVC, pending placement 

## 2020-10-29 NOTE — Consult Note (Signed)
[facility] Face-to-Face Psychiatry Consult   Reason for Consult: Consult for 75 year old man brought in under IVC after being found wandering around in the middle of the street acting bizarre Referring Physician: Su Hoff Patient Identification: REBEL WILLCUTT MRN:  [id number] Principal Diagnosis: Psychoactive substance-induced psychosis (HCC) Diagnosis:  Principal Problem:   Psychoactive substance-induced psychosis (HCC) Active Problems:   Cocaine abuse (HCC)   Alcohol abuse   Total Time spent with patient: 1 hour " Tired", Can I get water? "  D/W staff, and Pt seen today in his room, pt lying in  bed, pt very guarded, makes por eye contact, pt vague, suspicious, pt unable to tell the circumstances of his hospitalization, pt frequently states " I don't Know" Pt denies hallucination, denies Si/HI. Pt minimizing drinking , states " I drink a beer" denies recent drug use. UDS- pending, alcohol level- <10, pt ststes he is [place] vet but denies PTSD, states he gets tx through [place] but unable to give details.  Disorganized and tangential thought process.   Per Dr. Shary Key note-  Subjective:   GARON MELANDER is a 75 y.o. male patient admitted with "get away from me you are part of the plot".  HPI: Patient seen and chart reviewed.  Spoke also with his niece who is listed as his contact in the chart.  75 year old man brought here under IVC.  Law enforcement apparently picked him up where he was allegedly walking in the middle of the street endangering himself acting agitated and confused.  Patient was sleepy when he first came to the emergency room but this afternoon has woken up and is now very agitated.  Refused to cooperate with the interview.  Shouted abuse that me every time I would even attempt to speak with him.  Indicated that he believed that I and others around here are part of some sort of plot but did not go into details.  Alcohol level was negative.  No urine has been obtained for drug screen.  Patient  has not yet been physically aggressive but is currently quite loud.  Speaking to his niece she tells me that he is a veteran with chronic nerve problems and that he also has a habit of drinking and using cocaine too much.  She says that she believes he has probably been without sleep for 3 to 5 days using cocaine and drinking heavily.  He reports that in the past he will get like this when he has not slept for days and then after resting up we will get back to normal lucid mental state.  Nothing in the old chart about any previous psychiatric diagnoses.  Unknown whether he gets any VA care or has ever been diagnosed with PTSD.  No report of any seizures with alcohol withdrawal.  Past Psychiatric History: Niece reports that this sort of behavior when intoxicated for several days is familiar.  Unknown whether he has ever had any specific treatment nothing and are chart available.  Risk to Self:   Risk to Others:   Prior Inpatient Therapy:   Prior Outpatient Therapy:    Past Medical History:  Past Medical History:  Diagnosis Date   Coronary artery disease    Hypertension     Past Surgical History:  Procedure Laterality Date   CORONARY ARTERY BYPASS GRAFT     Family History: No family history on file. Family Psychiatric  History: None reported Social History:  Social History   Substance and Sexual Activity  Alcohol Use Yes  Alcohol/week: 168.0 standard drinks   Types: 168 Cans of beer per week   Comment: beer, wine     Social History   Substance and Sexual Activity  Drug Use Not Currently   Types: Cocaine, Marijuana    Social History   Socioeconomic History   Marital status: Single    Spouse name: Not on file   Number of children: Not on file   Years of education: Not on file   Highest education level: Not on file  Occupational History   Not on file  Tobacco Use   Smoking status: Former   Smokeless tobacco: Never  Vaping Use   Vaping Use: Never used  Substance and  Sexual Activity   Alcohol use: Yes    Alcohol/week: 168.0 standard drinks    Types: 168 Cans of beer per week    Comment: beer, wine   Drug use: Not Currently    Types: Cocaine, Marijuana   Sexual activity: Not on file  Other Topics Concern   Not on file  Social History Narrative   Not on file   Social Determinants of Health   Financial Resource Strain: Not on file  Food Insecurity: Not on file  Transportation Needs: Not on file  Physical Activity: Not on file  Stress: Not on file  Social Connections: Not on file   Additional Social History:    Allergies:  No Known Allergies  Labs:  No results found for this or any previous visit (from the past 48 hour(s)).   Current Facility-Administered Medications  Medication Dose Route Frequency Provider Last Rate Last Admin   diphenhydrAMINE (BENADRYL) injection 50 mg  50 mg Intramuscular Q6H PRN Clapacs, John T, MD   50 mg at 10/27/20 1707   folic acid (FOLVITE) tablet 1 mg  1 mg Oral Daily Clapacs, John T, MD       haloperidol lactate (HALDOL) injection 5 mg  5 mg Intramuscular Q6H PRN Clapacs, John T, MD   5 mg at 10/27/20 1709   LORazepam (ATIVAN) tablet 1-4 mg  1-4 mg Oral Q1H PRN Clapacs, Jackquline Denmark, MD       Or   LORazepam (ATIVAN) injection 1-4 mg  1-4 mg Intravenous Q1H PRN Clapacs, Jackquline Denmark, MD       LORazepam (ATIVAN) injection 2 mg  2 mg Intramuscular Q6H PRN Clapacs, Jackquline Denmark, MD   2 mg at 10/27/20 1709   multivitamin with minerals tablet 1 tablet  1 tablet Oral Daily Clapacs, Jackquline Denmark, MD       Current Outpatient Medications  Medication Sig Dispense Refill   aspirin 81 MG chewable tablet Chew 81 mg by mouth daily.     atorvastatin (LIPITOR) 80 MG tablet Take 80 mg by mouth daily.     lisinopril (ZESTRIL) 10 MG tablet Take 10 mg by mouth daily.      Musculoskeletal: Strength & Muscle Tone: within normal limits Gait & Station: normal Patient leans: N/A            Psychiatric Specialty Exam:  Presentation   General Appearance:  No data recorded Eye Contact: No data recorded Speech: No data recorded Speech Volume: No data recorded Handedness: No data recorded  Mood and Affect  Mood: No data recorded Affect: No data recorded  Thought Process  Thought Processes: No data recorded Descriptions of Associations:No data recorded Orientation:No data recorded Thought Content:No data recorded History of Schizophrenia/Schizoaffective disorder:No Duration of Psychotic Symptoms:Less than six months Hallucinations:No data recorded Ideas of  Reference:No data recorded Suicidal Thoughts:No data recorded Homicidal Thoughts:No data recorded  Sensorium  Memory: No data recorded Judgment: No data recorded Insight: No data recorded  Executive Functions  Concentration: No data recorded Attention Span: No data recorded Recall: No data recorded Fund of Knowledge: No data recorded Language: No data recorded  Psychomotor Activity  Psychomotor Activity: No data recorded  Assets  Assets: No data recorded  Sleep  Sleep: No data recorded  Physical Exam: Physical Exam Vitals and nursing note reviewed.  Constitutional:      Appearance: Normal appearance.  HENT:     Head: Normocephalic and atraumatic.     Mouth/Throat:     Pharynx: Oropharynx is clear.  Cardiovascular:     Rate and Rhythm: Normal rate and regular rhythm.  Pulmonary:     Effort: Pulmonary effort is normal.  Musculoskeletal:        General: Normal range of motion.  Skin:    General: Skin is warm and dry.  Neurological:     General: No focal deficit present.     Mental Status: He is alert. Mental status is at baseline.  Psychiatric:        Attention and Perception: He is inattentive.        Mood and Affect: Affect is labile, angry and inappropriate.        Speech: Speech is rapid and pressured and tangential.        Behavior: Behavior is agitated.        Thought Content: Thought content is paranoid.         Cognition and Memory: Cognition is impaired. Memory is impaired.        Judgment: Judgment is inappropriate.   Review of Systems  Unable to perform ROS: Psychiatric disorder  Blood pressure 124/61, pulse 76, temperature 98.2 F (36.8 C), temperature source Oral, resp. rate 17, height 5\' 8"  (1.727 m), weight 74.8 kg, SpO2 98 %. Body mass index is 25.07 kg/m.  Treatment Plan Summary: Pt is guarded, paranoid and psychotic.  Medication management and Plan 75 year old man picked up psychotic and agitated with collateral reports that he has been drinking and using drugs.  No alcohol found on presentation no drug screen done yet.  Patient refusing to cooperate with any kind of evaluation.  I have put orders in the chart for Haldol Ativan and Benadryl injections as needed for agitation.  We can also put in orders for the usual alcohol detox.  I have ordered a one-time shot of thiamine IM.  Patient will remain under IVC.  No psychiatric beds available here.  Unlikely to be admitted anywhere else but if we were going to try we would first look to the [facility].  We will reassess him tomorrow to see if he has settled down and if his mental state is improved any.  Disposition: Recommend inpatient psych tx when medically stabilized. Pt is IVced.   [place], MD 10/29/2020 1:31 PM

## 2020-10-29 NOTE — ED Notes (Signed)
Pt given a blanket

## 2020-10-29 NOTE — ED Provider Notes (Signed)
Emergency Medicine Observation Re-evaluation Note  Edgar Mcguire is a 75 y.o. male, seen on rounds today.  Pt initially presented to the ED for complaints of Mental Health Problem Currently, the patient is resting, no acute complaints.  Physical Exam  BP 124/61   Pulse 76   Temp 98.2 F (36.8 C) (Oral)   Resp 17   Ht 5\' 8"  (1.727 m)   Wt 74.8 kg   SpO2 98%   BMI 25.07 kg/m  Physical Exam General: No acute distress Lungs: equal chest rise Psych: calm  ED Course / MDM  EKG:   I have reviewed the labs performed to date as well as medications administered while in observation.  Recent changes in the last 24 hours include none.  Plan  Current plan is for inpatient. is under involuntary commitment.      Britt Bolognese, MD 10/29/20 [phone]

## 2020-10-29 NOTE — ED Notes (Signed)
Pt to nurse's station door asking why he was here and who brought him to the hospital.  RN explained he was brought here by [employer] and he was bring referred for inpatient treatment. Pt seemed confused and does not seem to remember coming to the ER.  Pt returned to his room.

## 2020-10-29 NOTE — ED Notes (Signed)
Pt given dinner tray an cup of juice 

## 2020-10-29 NOTE — ED Notes (Signed)
VS not obtained at this time due to pt being asleep.  

## 2020-10-29 NOTE — ED Notes (Signed)
Lunch tray given. 

## 2020-10-30 NOTE — ED Notes (Signed)
Pt taking a shower, provided with new scrubs. Pt asked to hold onto the deodorant and toothbrush/toothpaste, this tech said it was ok.

## 2020-10-30 NOTE — ED Provider Notes (Signed)
Emergency Medicine Observation Re-evaluation Note  Edgar Mcguire is a 75 y.o. male, seen on rounds today.  Pt initially presented to the ED for complaints of Mental Health Problem Currently, the patient is resting, no new complaints.  Physical Exam  BP (!) 146/90 (BP Location: Left Arm)   Pulse 76   Temp 97.8 F (36.6 C) (Oral)   Resp 18   Ht 5\' 8"  (1.727 m)   Wt 74.8 kg   SpO2 100%   BMI 25.07 kg/m  Physical Exam General: no acute distress Lungs: equal chest rise Psych: calm  ED Course / MDM  EKG:   I have reviewed the labs performed to date as well as medications administered while in observation.  Recent changes in the last 24 hours include none.  Plan  Current plan is for inpatient. is under involuntary commitment.      Britt Bolognese, MD 10/30/20 [phone]

## 2020-10-30 NOTE — ED Notes (Signed)
IVC pending placement 

## 2020-10-30 NOTE — ED Notes (Signed)
Pt given dinner tray.

## 2020-10-30 NOTE — ED Notes (Signed)
Pt given breakfast tray and juice at this time. 

## 2020-10-31 DIAGNOSIS — F23 Brief psychotic disorder: Secondary | ICD-10-CM

## 2020-10-31 DIAGNOSIS — F19959 Other psychoactive substance use, unspecified with psychoactive substance-induced psychotic disorder, unspecified: Secondary | ICD-10-CM | POA: Diagnosis not present

## 2020-10-31 NOTE — Consult Note (Signed)
Edgar Mcguire   Reason for Mcguire: Follow-up Mcguire for this 75 year old man who was brought to the ER late last week. Referring Physician: Fanny Mcguire Patient Identification: Edgar Mcguire MRN:  [id number] Principal Diagnosis: Brief reactive psychosis (HCC) Diagnosis:  Principal Problem:   Brief reactive psychosis (HCC) Active Problems:   Cocaine abuse (HCC)   Alcohol abuse   Psychoactive substance-induced psychosis (HCC)   Total Time spent with patient: 30 minutes  Subjective:   Edgar Mcguire is a 75 y.o. male patient admitted with "I do not know why I am here".  HPI: Patient seen chart reviewed.  This 75 year old man was brought to the emergency room late last week by law enforcement who report that they had found him wandering around in the streets acting paranoid.  When initially seen in the emergency room he was paranoid belligerent shouting a lot but not showing any signs of hurting himself or anyone else.  Patient has slept and calm to down over the weekend.  On interview today he has no memory of coming to the hospital.  I told him in brief what brought him into the hospital.  He stated that he still remembers that people were trying to harm him but does not know any detail about it.  Admits that he drinks alcohol regularly although his alcohol level on presentation was 0.  He continues to deny any recent cocaine or drug use although no urine drug screen was obtained.  Currently the patient is awake and oriented and lucid.  Denies suicidal or homicidal ideation.  States an understanding of his medical problems including his high blood pressure  Past Psychiatric History: No real records available except that we have seen him once before in the ER in similar condition except more intoxicated.  Patient swears that he has never seen a psychiatrist therapist or mental health provider for anything in the past.  Denies any history of suicide attempts.  Risk to Self:   Risk  to Others:   Prior Inpatient Therapy:   Prior Outpatient Therapy:    Past Medical History:  Past Medical History:  Diagnosis Date   Coronary artery disease    Hypertension     Past Surgical History:  Procedure Laterality Date   CORONARY ARTERY BYPASS GRAFT     Family History: No family history on file. Family Psychiatric  History: None known Social History:  Social History   Substance and Sexual Activity  Alcohol Use Yes   Alcohol/week: 168.0 standard drinks   Types: 168 Cans of beer per week   Comment: beer, wine     Social History   Substance and Sexual Activity  Drug Use Not Currently   Types: Cocaine, Marijuana    Social History   Socioeconomic History   Marital status: Single    Spouse name: Not on file   Number of children: Not on file   Years of education: Not on file   Highest education level: Not on file  Occupational History   Not on file  Tobacco Use   Smoking status: Former   Smokeless tobacco: Never  Vaping Use   Vaping Use: Never used  Substance and Sexual Activity   Alcohol use: Yes    Alcohol/week: 168.0 standard drinks    Types: 168 Cans of beer per week    Comment: beer, wine   Drug use: Not Currently    Types: Cocaine, Marijuana   Sexual activity: Not on file  Other Topics Concern  Not on file  Social History Narrative   Not on file   Social Determinants of Health   Financial Resource Strain: Not on file  Food Insecurity: Not on file  Transportation Needs: Not on file  Physical Activity: Not on file  Stress: Not on file  Social Connections: Not on file   Additional Social History:    Allergies:  No Known Allergies  Labs: No results found for this or any previous visit (from the past 48 hour(s)).  Current Facility-Administered Medications  Medication Dose Route Frequency Provider Last Rate Last Admin   diphenhydrAMINE (BENADRYL) injection 50 mg  50 mg Intramuscular Q6H PRN Mekenzie Modeste T, MD   50 mg at 10/27/20 1707    folic acid (FOLVITE) tablet 1 mg  1 mg Oral Daily Tonny Isensee T, MD   1 mg at 10/31/20 1005   haloperidol lactate (HALDOL) injection 5 mg  5 mg Intramuscular Q6H PRN Shjon Lizarraga T, MD   5 mg at 10/27/20 1709   LORazepam (ATIVAN) injection 2 mg  2 mg Intramuscular Q6H PRN Deaisha Welborn, Jackquline Denmark, MD   2 mg at 10/27/20 1709   multivitamin with minerals tablet 1 tablet  1 tablet Oral Daily Artemisia Auvil, Jackquline Denmark, MD   1 tablet at 10/31/20 1005   Current Outpatient Medications  Medication Sig Dispense Refill   aspirin 81 MG chewable tablet Chew 81 mg by mouth daily.     atorvastatin (LIPITOR) 80 MG tablet Take 80 mg by mouth daily.     lisinopril (ZESTRIL) 10 MG tablet Take 10 mg by mouth daily.      Musculoskeletal: Strength & Muscle Tone: within normal limits Gait & Station: normal Patient leans: N/A            Psychiatric Specialty Exam:  Presentation  General Appearance:  No data recorded Eye Contact: No data recorded Speech: No data recorded Speech Volume: No data recorded Handedness: No data recorded  Mood and Affect  Mood: No data recorded Affect: No data recorded  Thought Process  Thought Processes: No data recorded Descriptions of Associations:No data recorded Orientation:No data recorded Thought Content:No data recorded History of Schizophrenia/Schizoaffective disorder:No  Duration of Psychotic Symptoms:Less than six months  Hallucinations:No data recorded Ideas of Reference:No data recorded Suicidal Thoughts:No data recorded Homicidal Thoughts:No data recorded  Sensorium  Memory: No data recorded Judgment: No data recorded Insight: No data recorded  Executive Functions  Concentration: No data recorded Attention Span: No data recorded Recall: No data recorded Fund of Knowledge: No data recorded Language: No data recorded  Psychomotor Activity  Psychomotor Activity: No data recorded  Assets  Assets: No data recorded  Sleep   Sleep: No data recorded  Physical Exam: Physical Exam Vitals and nursing note reviewed.  Constitutional:      Appearance: Normal appearance.  HENT:     Head: Normocephalic and atraumatic.     Mouth/Throat:     Pharynx: Oropharynx is clear.  Eyes:     Pupils: Pupils are equal, round, and reactive to light.  Cardiovascular:     Rate and Rhythm: Normal rate and regular rhythm.  Pulmonary:     Effort: Pulmonary effort is normal.     Breath sounds: Normal breath sounds.  Abdominal:     General: Abdomen is flat.     Palpations: Abdomen is soft.  Musculoskeletal:        General: Normal range of motion.  Skin:    General: Skin is warm and dry.  Neurological:  General: No focal deficit present.     Mental Status: He is alert. Mental status is at baseline.  Psychiatric:        Attention and Perception: Attention normal.        Mood and Affect: Mood normal.        Speech: Speech normal.        Behavior: Behavior normal.        Thought Content: Thought content normal.        Cognition and Memory: Memory is impaired.        Judgment: Judgment normal.   Review of Systems  Constitutional: Negative.   HENT: Negative.    Eyes: Negative.   Respiratory: Negative.    Cardiovascular: Negative.   Gastrointestinal: Negative.   Musculoskeletal: Negative.   Skin: Negative.   Neurological: Negative.   Psychiatric/Behavioral:  Positive for memory loss. Negative for depression, hallucinations, substance abuse and suicidal ideas. The patient is not nervous/anxious and does not have insomnia.   Blood pressure 123/89, pulse 81, temperature 98.1 F (36.7 C), resp. rate 18, height 5\' 8"  (1.727 m), weight 74.8 kg, SpO2 100 %. Body mass index is 25.07 kg/m.  Treatment Plan Summary: Plan at this point the diagnosis is unclear it is possible that it could have been substance induced it is possible that he could have a kind of bipolar disorder that presents with brief reactive psychosis.  In  either case he is currently apparently back to his baseline and does not currently meet commitment criteria.  He is not hostile or aggressive.  He denies suicidal ideation.  He is not showing any inappropriate behavior.  I had spoken to a family member last week who was agreeable to picking him up as soon as we discharged him.  Patient is [facility] and [facility] health care if needed and encouraged to stay away from alcohol and drugs.  Discontinue IVC.  Case reviewed with ER physician.  Disposition: No evidence of imminent risk to self or others at present.   Patient does not meet criteria for psychiatric inpatient admission. Supportive therapy provided about ongoing stressors. Discussed crisis plan, support from social network, calling 911, coming to the Emergency Department, and calling Suicide Hotline.  , MD 10/31/2020 11:16 AM

## 2020-10-31 NOTE — ED Provider Notes (Addendum)
Emergency Medicine Observation Re-evaluation Note  Edgar Mcguire is a 75 y.o. male, seen on rounds today.  Pt initially presented to the ED for complaints of Mental Health Problem Currently, the patient is resting, no new complaints.  Physical Exam  BP 123/89   Pulse 81   Temp 98.1 F (36.7 C)   Resp 18   Ht 5\' 8"  (1.727 m)   Wt 74.8 kg   SpO2 100%   BMI 25.07 kg/m  Physical Exam General: no acute distress, ambulatory in day room without difficulty. Lungs: equal chest rise Psych: calm  ED Course / MDM  EKG:   I have reviewed the labs performed to date as well as medications administered while in observation.  Recent changes in the last 24 hours include none.  Plan  Current plan is for inpatient. is under involuntary commitment.      Britt Bolognese, MD 10/31/20 1044   ----------------------------------------- 12:31 PM on 10/31/2020 ----------------------------------------- Patient has been seen and rounded on with IVC rescinded by Dr. 11/02/2020 of psychiatry.  He is advised "No evidence of imminent risk to self or others at present.   Patient does not meet criteria for psychiatric inpatient admission. Supportive therapy provided about ongoing stressors. Discussed crisis plan, support from social network, calling 911, coming to the Emergency Department, and calling Suicide Hotline."  Also recommends he follow-up with the [facility].  Patient will be discharged at this time, appropriate for ongoing outpatient follow-up    [facility], MD 10/31/20 1231

## 2020-10-31 NOTE — ED Notes (Signed)
Rescinded IVC by Dr. Clapacs 

## 2020-10-31 NOTE — Discharge Instructions (Addendum)

## 2020-11-16 ENCOUNTER — Emergency Department: Payer: No Typology Code available for payment source

## 2020-11-16 ENCOUNTER — Emergency Department
Admission: EM | Admit: 2020-11-16 | Discharge: 2020-11-16 | Disposition: A | Discharge status: Home / Self Care | Payer: No Typology Code available for payment source | Attending: Emergency Medicine | Admitting: Emergency Medicine

## 2020-11-16 ENCOUNTER — Other Ambulatory Visit: Payer: Self-pay

## 2020-11-16 ENCOUNTER — Encounter: Payer: Self-pay | Admitting: Emergency Medicine

## 2020-11-16 DIAGNOSIS — Z79899 Other long term (current) drug therapy: Secondary | ICD-10-CM | POA: Insufficient documentation

## 2020-11-16 DIAGNOSIS — I10 Essential (primary) hypertension: Secondary | ICD-10-CM | POA: Insufficient documentation

## 2020-11-16 DIAGNOSIS — I2510 Atherosclerotic heart disease of native coronary artery without angina pectoris: Secondary | ICD-10-CM | POA: Diagnosis not present

## 2020-11-16 DIAGNOSIS — Z7982 Long term (current) use of aspirin: Secondary | ICD-10-CM | POA: Diagnosis not present

## 2020-11-16 DIAGNOSIS — M7989 Other specified soft tissue disorders: Secondary | ICD-10-CM | POA: Diagnosis present

## 2020-11-16 DIAGNOSIS — I872 Venous insufficiency (chronic) (peripheral): Secondary | ICD-10-CM | POA: Insufficient documentation

## 2020-11-16 DIAGNOSIS — R600 Localized edema: Secondary | ICD-10-CM | POA: Insufficient documentation

## 2020-11-16 DIAGNOSIS — Z951 Presence of aortocoronary bypass graft: Secondary | ICD-10-CM | POA: Diagnosis not present

## 2020-11-16 DIAGNOSIS — R609 Edema, unspecified: Secondary | ICD-10-CM

## 2020-11-16 DIAGNOSIS — Z87891 Personal history of nicotine dependence: Secondary | ICD-10-CM | POA: Insufficient documentation

## 2020-11-16 LAB — CBC
HCT: 35.5 % — ABNORMAL LOW (ref 39.0–52.0)
Hemoglobin: 12.3 g/dL — ABNORMAL LOW (ref 13.0–17.0)
MCH: 33.5 pg (ref 26.0–34.0)
MCHC: 34.6 g/dL (ref 30.0–36.0)
MCV: 96.7 fL (ref 80.0–100.0)
Platelets: 191 10*3/uL (ref 150–400)
RBC: 3.67 MIL/uL — ABNORMAL LOW (ref 4.22–5.81)
RDW: 16.7 % — ABNORMAL HIGH (ref 11.5–15.5)
WBC: 4.9 10*3/uL (ref 4.0–10.5)
nRBC: 0 % (ref 0.0–0.2)

## 2020-11-16 LAB — HEPATIC FUNCTION PANEL
ALT: 17 U/L (ref 0–44)
AST: 23 U/L (ref 15–41)
Albumin: 3.1 g/dL — ABNORMAL LOW (ref 3.5–5.0)
Alkaline Phosphatase: 50 U/L (ref 38–126)
Bilirubin, Direct: 0.2 mg/dL (ref 0.0–0.2)
Indirect Bilirubin: 0.9 mg/dL (ref 0.3–0.9)
Total Bilirubin: 1.1 mg/dL (ref 0.3–1.2)
Total Protein: 6.8 g/dL (ref 6.5–8.1)

## 2020-11-16 LAB — BASIC METABOLIC PANEL
Anion gap: 11 (ref 5–15)
BUN: 5 mg/dL — ABNORMAL LOW (ref 8–23)
CO2: 26 mmol/L (ref 22–32)
Calcium: 8.4 mg/dL — ABNORMAL LOW (ref 8.9–10.3)
Chloride: 100 mmol/L (ref 98–111)
Creatinine, Ser: 0.92 mg/dL (ref 0.61–1.24)
GFR, Estimated: >60 mL/min (ref >60)
Glucose, Bld: 139 mg/dL — ABNORMAL HIGH (ref 70–99)
Potassium: 3.7 mmol/L (ref 3.5–5.1)
Sodium: 137 mmol/L (ref 135–145)

## 2020-11-16 LAB — BRAIN NATRIURETIC PEPTIDE: B Natriuretic Peptide: 48.7 pg/mL (ref 0.0–100.0)

## 2020-11-16 MED ORDER — CEPHALEXIN 500 MG PO CAPS: 500.0000 mg | ORAL_CAPSULE | Freq: Four times a day (QID) | ORAL | 0 refills | Status: AC

## 2020-11-16 MED ORDER — IBUPROFEN 400 MG PO TABS
400.0000 mg | ORAL_TABLET | Freq: Once | ORAL | Status: AC
  Administered 2020-11-16: 400 mg via ORAL
  Filled 2020-11-16: qty 1

## 2020-11-16 MED ORDER — CEPHALEXIN 500 MG PO CAPS
500.0000 mg | ORAL_CAPSULE | Freq: Once | ORAL | Status: AC
  Administered 2020-11-16: 500 mg via ORAL
  Filled 2020-11-16: qty 1

## 2020-11-16 NOTE — ED Notes (Signed)
Pt upset that he is being discharged. Pt states he expected his legs to be completely healed at discharge. Pt educated about use of antibiotics, but pt did not seem to be interested in education. Pt told multiple times about how to properly take the keflex.

## 2020-11-16 NOTE — ED Triage Notes (Signed)
Pt comes into the ED via POV c/o bilateral foot swelling.  Pt states that the swelling started 2 days ago and is progressively getting worse.  Pt denies any known CHF.  PT denies any SHOB or chest pain.  Pt currently in NAD at this time.

## 2020-11-16 NOTE — ED Notes (Signed)
See triage note  Presents with swelling to both lower extremities  States he noticed swelling 2 days ago  3+edema noted with redness

## 2020-11-16 NOTE — ED Provider Notes (Signed)
[facility]  ____________________________________________   Event Date/Time   First MD Initiated Contact with Patient 11/16/20 [phone]     (approximate)  I have reviewed the triage vital signs and the nursing notes.   HISTORY  Chief Complaint Foot Swelling    HPI Edgar Mcguire is a 76 y.o. male pmh of CAD, HTN, alcohol use disorder who p/w bilateral foot swelling.  Patient notes that over the past 2 days he has developed significant swelling of the bilateral ankles and feet.  It is now painful for him to ambulate.  He notes that he is frequently outside and does walk around a lot.  Denies sleeping in a recliner, has not tried elevating his legs.  He denies any associated symptoms including shortness of breath chest pain.  Denies any symptoms of infection including fevers chills.  He denies any history of CHF or any prior diuretic use.  Does have a history of daily alcohol use, does not hold a diagnosis of cirrhosis.  He denies any burn or other exposure.  He follows at the [facility] for his care.  No documented hx of CHF or echo on file.          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 Authorizing Provider  cephALEXin (KEFLEX) 500 MG capsule Take 1 capsule (500 mg total) by mouth 4 (four) times daily for 5 days. 11/16/20 11/21/20 Yes Georga Hacking, MD  aspirin 81 MG chewable tablet Chew 81 mg by mouth daily.    [provider]  atorvastatin (LIPITOR) 80 MG tablet Take 80 mg by mouth daily.    [provider]   lisinopril (ZESTRIL) 10 MG tablet Take 10 mg by mouth daily.    [provider]    Allergies Patient has no known allergies.  History reviewed. No pertinent family history.  Social History Social History   Tobacco Use   Smoking status: Former   Smokeless tobacco: Never  Building services engineer Use: Never used  Substance Use Topics   Alcohol use: Yes    Alcohol/week: 168.0 standard drinks    Types: 168 Cans of beer per week    Comment: beer, wine   Drug use: Not Currently    Types: Cocaine, Marijuana    Review of Systems   Review of Systems  Constitutional:  Negative for appetite change, chills and fever.  Respiratory:  Negative for shortness of breath.   Cardiovascular:  Positive for leg swelling. Negative for chest pain.  Gastrointestinal:  Negative for abdominal pain, nausea and vomiting.  Musculoskeletal:  Positive for arthralgias and myalgias.  All other systems reviewed and are negative.  Physical Exam Updated Vital Signs BP (!) 159/99 (BP Location: Left Arm)   Pulse 83   Temp 98.1 F (36.7 C) (Oral)   Resp 18   Ht 5\' 8"  (1.727 m)   Wt 74.8 kg   SpO2 100%   BMI 25.07 kg/m   Physical Exam Vitals and nursing note reviewed.  Constitutional:  General: He is not in acute distress.    Appearance: Normal appearance.  HENT:     Head: Normocephalic and atraumatic.  Eyes:     General: No scleral icterus.    Conjunctiva/sclera: Conjunctivae normal.  Pulmonary:     Effort: Pulmonary effort is normal. No respiratory distress.     Breath sounds: Normal breath sounds. No wheezing or rales.  Musculoskeletal:        General: No deformity or signs of injury.     Cervical back: Normal range of motion.     Comments: Significant pitting edema of the bilateral feet up to the lower calf There is some erythema on the medial aspect of the bilateral feet Lateral lower extremities are warm to touch There is no crepitus Compartments are soft Patient is able to  ambulate  Skin:    Coloration: Skin is not jaundiced or pale.  Neurological:     General: No focal deficit present.     Mental Status: He is alert and oriented to person, place, and time. Mental status is at baseline.  Psychiatric:        Mood and Affect: Mood normal.        Behavior: Behavior normal.     LABS (all labs ordered are listed, but only abnormal results are displayed)  Labs Reviewed  CBC - Abnormal; Notable for the following components:      Result Value   RBC 3.67 (*)    Hemoglobin 12.3 (*)    HCT 35.5 (*)    RDW 16.7 (*)    All other components within normal limits  BASIC METABOLIC PANEL - Abnormal; Notable for the following components:   Glucose, Bld 139 (*)    BUN 5 (*)    Calcium 8.4 (*)    All other components within normal limits  HEPATIC FUNCTION PANEL - Abnormal; Notable for the following components:   Albumin 3.1 (*)    All other components within normal limits  BRAIN NATRIURETIC PEPTIDE   ____________________________________________  EKG  N/a ____________________________________________  RADIOLOGY Ky Barban, personally viewed and evaluated these images (plain radiographs) as part of my medical decision making, as well as reviewing the written report by the radiologist.  ED MD interpretation:  n/a    ____________________________________________   PROCEDURES  Procedure(s) performed (including Critical Care):  Procedures   ____________________________________________   INITIAL IMPRESSION / ASSESSMENT AND PLAN / ED COURSE     The patient is a 75 year old male who presents with 2 days of swelling of the feet and mid calf.  On exam there is significant pitting edema with some erythema and overall inflammation.  Does not appear grossly cellulitic, there is no crepitus.  Patient without other systemic symptoms of CHF including shortness of breath or chest pain and no systemic symptoms of infection.  Patient does ambulate outside  frequently.  I obtained labs which show no evidence of liver disease, although I suppose he could have underlying cirrhosis.  His BNP is normal as is his renal function.  Obtained x-rays which did not show any gas or osteomyelitis.  My suspicion for DVT is exceedingly low given it is bilateral.  I suspect that this is an exacerbation of chronic venous insufficiency in the setting of walking frequently especially in this heat.  Will cover with Keflex in case he is developing a superimposed infection of the overall my suspicion is low.  I recommended that the patient elevate his legs while sitting and sleeping and  start using compression stockings.  We discussed following up with his provider at the [place].  Clinical Course as of 11/16/20 1235  Wed Nov 16, 2020  9432 B Natriuretic Peptide: 48.7 [KM]    Clinical Course User Index [KM] Georga Hacking, MD     ____________________________________________   FINAL CLINICAL IMPRESSION(S) / ED DIAGNOSES  Final diagnoses:  Edema, unspecified type  Venous insufficiency     ED Discharge Orders          Ordered    cephALEXin (KEFLEX) 500 MG capsule  4 times daily        11/16/20 1019             Note:  This document was prepared using Dragon voice recognition software and may include unintentional dictation errors.    Georga Hacking, MD 11/16/20 1235

## 2020-11-16 NOTE — Discharge Instructions (Addendum)
Please take the antibiotic daily to prevent infection.  Please elevate your legs and start using compression stockings.  Please follow-up at the [place] within the next week for further evaluation.

## 2022-04-02 IMAGING — DX DG FOOT COMPLETE 3+V*L*
3 series · 3 of 3 positions shown · non-contrast
Comparison: None.

CLINICAL DATA: 74-year-old male with bilateral open foot wounds.

EXAM:
LEFT FOOT - COMPLETE 3+ VIEW

[foot ap]
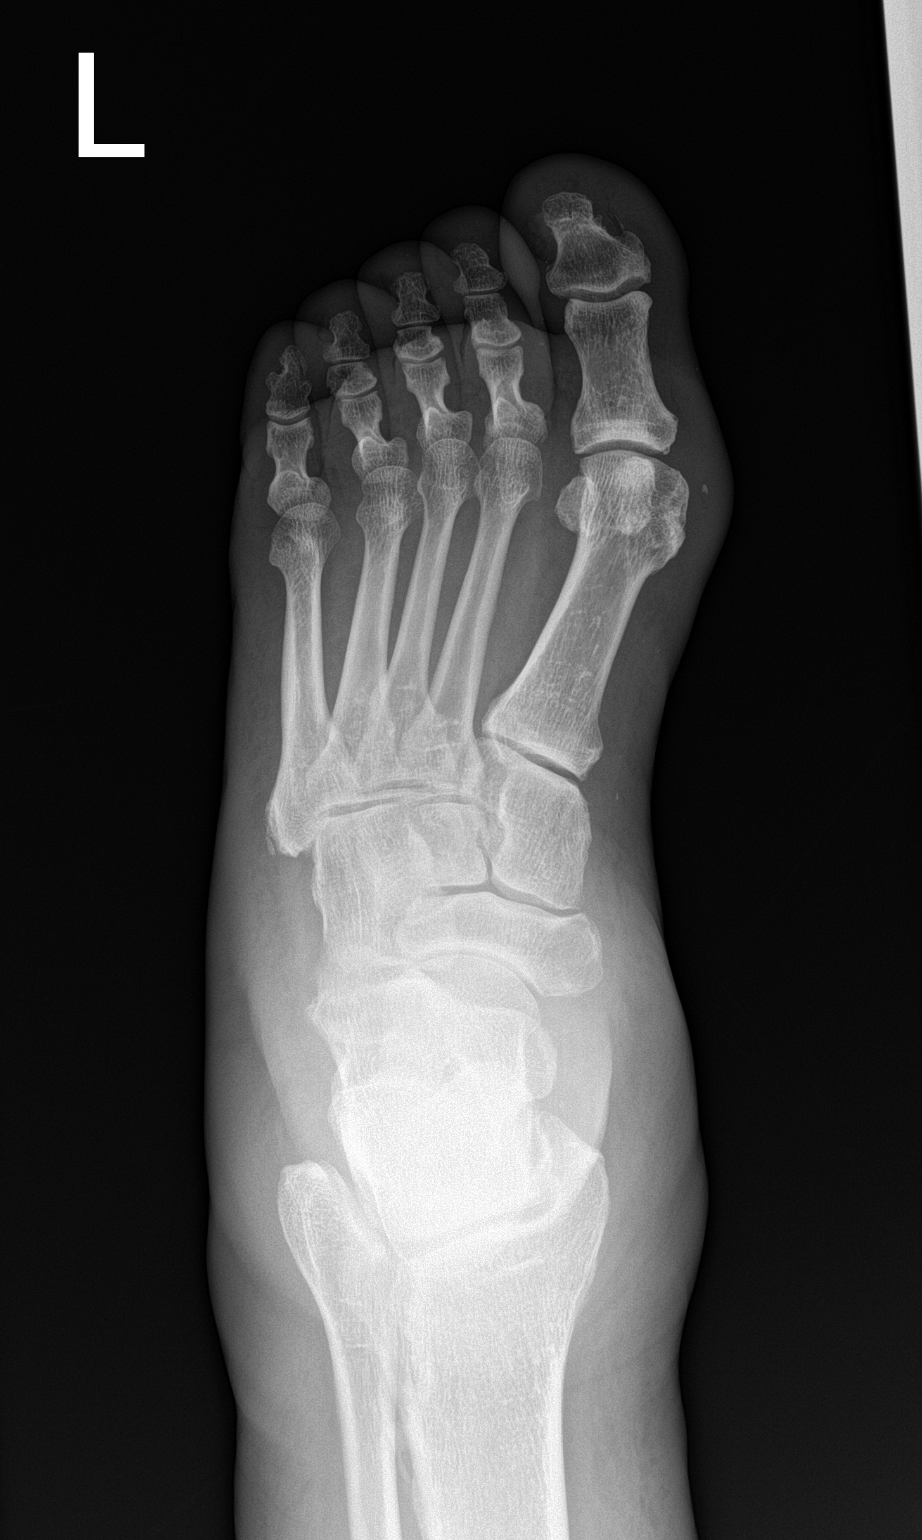

[foot obl]
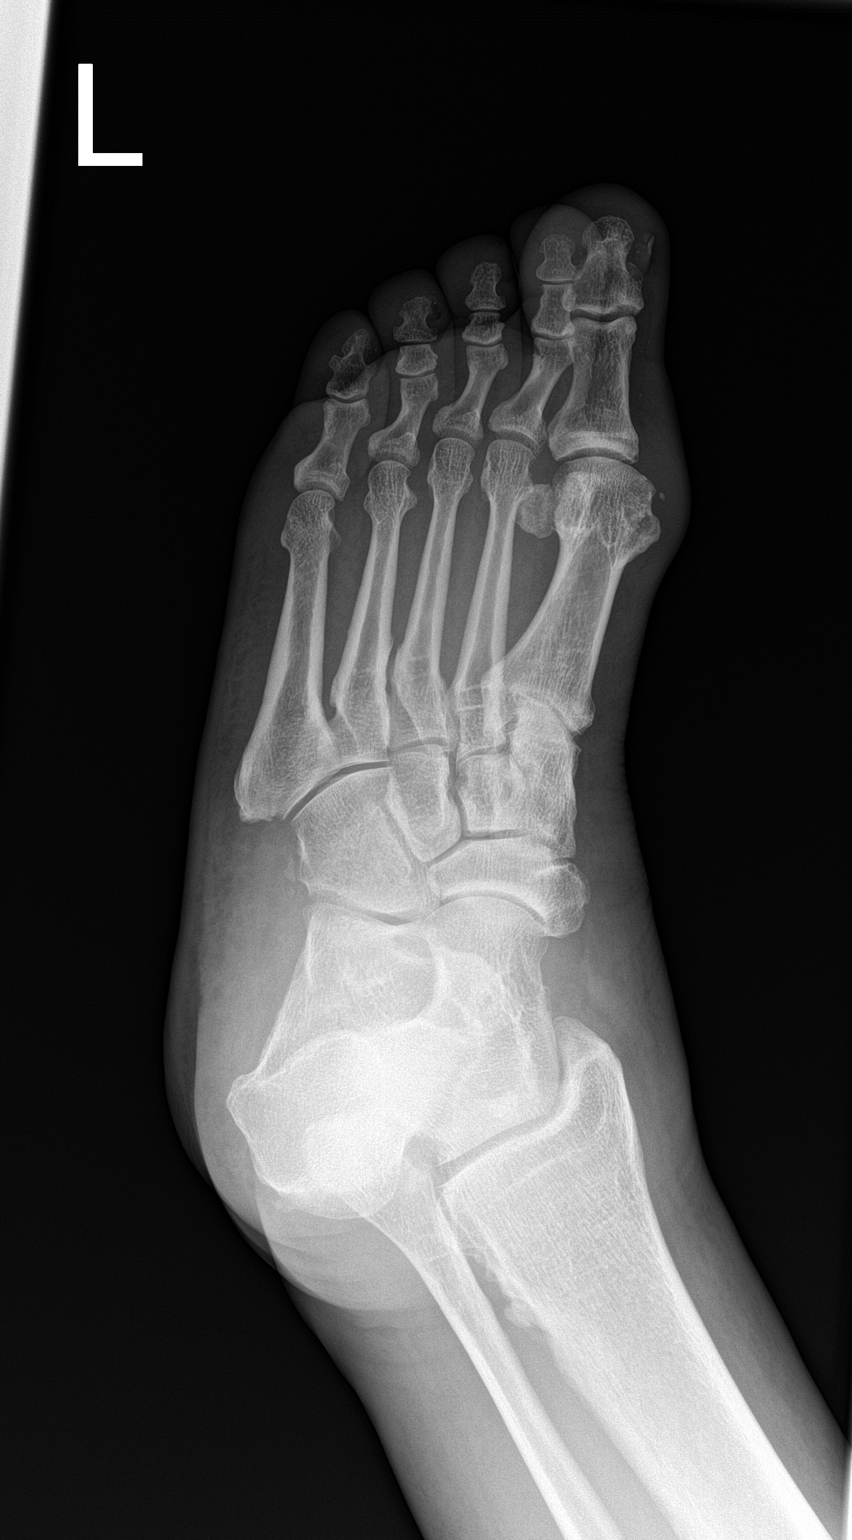

[foot lat]
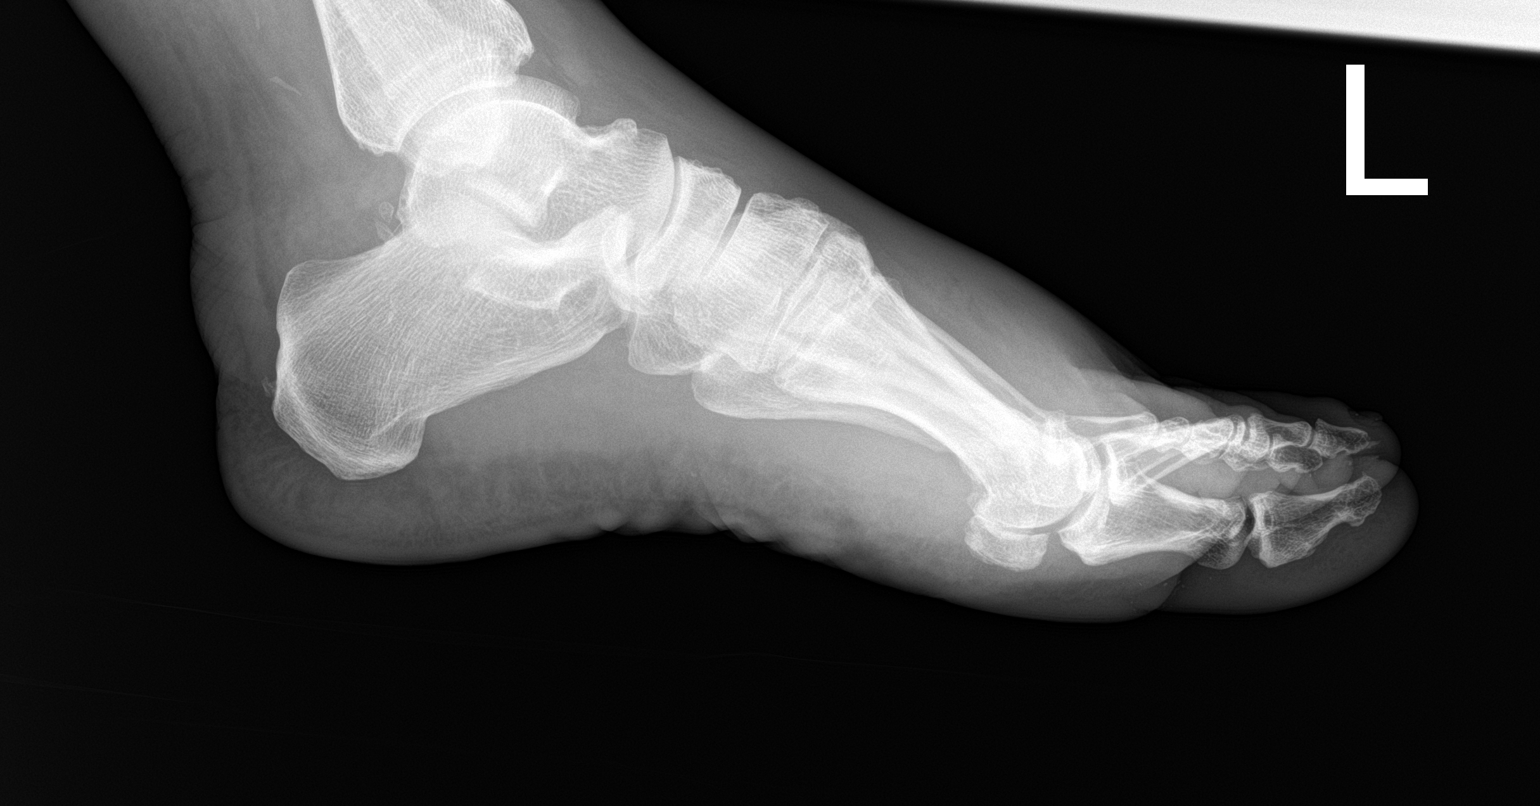

[3 of 3 positions shown; findings below may reference images not displayed]

FINDINGS: Mild calcified peripheral vascular disease. Generalized soft tissue
swelling, maximal at the mid metatarsal level. No soft tissue gas.

Bone mineralization is within normal limits. Minor osteoarthritis at
the 1st MTP joint. No fracture, dislocation, or osteolysis
identified.
IMPRESSION: Soft tissue swelling with no radiographic evidence of osteomyelitis
or acute osseous abnormality identified.
